# Patient Record
Sex: Male | Born: 1985 | Hispanic: Yes | Marital: Married | Smoking: Never smoker
Health system: Southern US, Community
[De-identification: ages and names within clinical notes are randomized; demographics above are authoritative.]

## PROBLEM LIST (undated history)

## (undated) DIAGNOSIS — F1290 Cannabis use, unspecified, uncomplicated: Secondary | ICD-10-CM

## (undated) DIAGNOSIS — W3400XA Accidental discharge from unspecified firearms or gun, initial encounter: Secondary | ICD-10-CM

## (undated) DIAGNOSIS — H9192 Unspecified hearing loss, left ear: Secondary | ICD-10-CM

## (undated) HISTORY — DX: Unspecified hearing loss, left ear: H91.92

## (undated) HISTORY — DX: Accidental discharge from unspecified firearms or gun, initial encounter: W34.00XA

---
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
## 2008-12-04 ENCOUNTER — Emergency Department (HOSPITAL_COMMUNITY): Admission: EM | Admit: 2008-12-04 | Discharge: 2008-12-04 | Payer: Self-pay | Admitting: Emergency Medicine

## 2013-11-08 ENCOUNTER — Emergency Department (HOSPITAL_COMMUNITY): Payer: Self-pay

## 2013-11-08 ENCOUNTER — Emergency Department (HOSPITAL_COMMUNITY)
Admission: EM | Admit: 2013-11-08 | Discharge: 2013-11-08 | Disposition: A | Discharge status: Home / Self Care | Payer: No Typology Code available for payment source | Attending: Emergency Medicine | Admitting: Emergency Medicine

## 2013-11-08 ENCOUNTER — Emergency Department (HOSPITAL_COMMUNITY): Payer: No Typology Code available for payment source

## 2013-11-08 ENCOUNTER — Encounter (HOSPITAL_COMMUNITY): Payer: Self-pay | Admitting: Emergency Medicine

## 2013-11-08 DIAGNOSIS — Y92410 Unspecified street and highway as the place of occurrence of the external cause: Secondary | ICD-10-CM | POA: Insufficient documentation

## 2013-11-08 DIAGNOSIS — F10920 Alcohol use, unspecified with intoxication, uncomplicated: Secondary | ICD-10-CM

## 2013-11-08 DIAGNOSIS — S2239XA Fracture of one rib, unspecified side, initial encounter for closed fracture: Secondary | ICD-10-CM | POA: Insufficient documentation

## 2013-11-08 DIAGNOSIS — R4182 Altered mental status, unspecified: Secondary | ICD-10-CM | POA: Insufficient documentation

## 2013-11-08 DIAGNOSIS — IMO0002 Reserved for concepts with insufficient information to code with codable children: Secondary | ICD-10-CM | POA: Insufficient documentation

## 2013-11-08 DIAGNOSIS — S2231XA Fracture of one rib, right side, initial encounter for closed fracture: Secondary | ICD-10-CM

## 2013-11-08 DIAGNOSIS — Y9389 Activity, other specified: Secondary | ICD-10-CM | POA: Insufficient documentation

## 2013-11-08 DIAGNOSIS — F1010 Alcohol abuse, uncomplicated: Secondary | ICD-10-CM | POA: Insufficient documentation

## 2013-11-08 LAB — BASIC METABOLIC PANEL
ANION GAP: 14 (ref 5–15)
BUN: 12 mg/dL (ref 6–23)
CALCIUM: 8.8 mg/dL (ref 8.4–10.5)
CHLORIDE: 101 meq/L (ref 96–112)
CO2: 24 meq/L (ref 19–32)
Creatinine, Ser: 0.96 mg/dL (ref 0.50–1.35)
GFR calc Af Amer: >90 mL/min (ref >90)
GFR calc non Af Amer: >90 mL/min (ref >90)
GLUCOSE: 100 mg/dL — AB (ref 70–99)
POTASSIUM: 3.5 meq/L — AB (ref 3.7–5.3)
SODIUM: 139 meq/L (ref 137–147)

## 2013-11-08 LAB — CBC
HCT: 40.4 % (ref 39.0–52.0)
HEMOGLOBIN: 14 g/dL (ref 13.0–17.0)
MCH: 31 pg (ref 26.0–34.0)
MCHC: 34.7 g/dL (ref 30.0–36.0)
MCV: 89.4 fL (ref 78.0–100.0)
PLATELETS: 185 10*3/uL (ref 150–400)
RBC: 4.52 MIL/uL (ref 4.22–5.81)
RDW: 12.9 % (ref 11.5–15.5)
WBC: 13.2 10*3/uL — AB (ref 4.0–10.5)

## 2013-11-08 LAB — ETHANOL: Alcohol, Ethyl (B): 259 mg/dL — ABNORMAL HIGH (ref 0–11)

## 2013-11-08 MED ORDER — HYDROCODONE-ACETAMINOPHEN 5-325 MG PO TABS: 1.0000 | ORAL_TABLET | ORAL | Status: DC | PRN

## 2013-11-08 NOTE — ED Notes (Signed)
Pt given phone to call friend to come to pick him up.

## 2013-11-08 NOTE — ED Notes (Signed)
Per EMS and [employer]-no LOC

## 2013-11-08 NOTE — ED Notes (Signed)
Bed: WA15 Expected date:  Expected time:  Means of arrival:  Comments: Ems 

## 2013-11-08 NOTE — ED Notes (Signed)
Pt ambulated to bathroom without assistance 
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
## 2013-11-08 NOTE — Discharge Instructions (Signed)
Fracturas de costilla  (Rib Fracture)  Una fractura de costilla es la ruptura de uno de los huesos que la forman. Las costillas son un grupo de huesos largos y curvos que rodean el pecho y estn adheridos a la columna vertebral. Protegen a los pulmones y a otros rganos que se encuentran en la cavidad torcica. La fractura o fisura de una costilla puede ser dolorosa pero no causa otros problemas. La mayora de las fracturas de costillas se curan por s mismas luego de un tiempo. Sin embargo, puede llegar a ser ms grave si se rompen varias costillas o si se desplazan y empujan otras estructuras. CAUSAS   Un golpe directo en el pecho. Por ejemplo, esto puede ocurrir durante la prctica de deportes de contacto, un accidente automovilstico o una cada sobre un objeto duro.  Los movimientos repetitivos con una gran fuerza, como al lanzar una pelota en el bisbol, o tener episodios de tos intensos. SNTOMAS   Dolor al respirar o al toser.  Dolor cuando alguien presiona la zona lesionada. DIAGNSTICO  El mdico le har un examen fsico. Le indicar diferentes estudios por imgenes para confirmar el diagnstico y buscar lesiones relacionadas. Estos estudios incluyen radiografas de trax, tomografa computada (TC), resonancia magntica (MRI) o gammagrafa sea.  TRATAMIENTO  La mayora de las fracturas de costillas se curan por s mismas en 1 a 3 meses. Los perodos de curacin ms prolongados generalmente se asocian a tos continua o a otras actividades que agravan el problema. Durante el perodo de curacin es muy importante el control del dolor. Generalmente se recetan medicamentos para controlar el dolor. Ser necesaria la hospitalizacin o una ciruga si hay lesiones ms graves, como las fracturas de mltiples costillas o aquellas en las que las costillas se desplazan.  INSTRUCCIONES PARA EL CUIDADO EN EL HOGAR   Evite las actividades extenuantes y toda actividad o movimiento que le cause dolor.  Realice las actividades con cuidado y evite golpearse las costillas lesionadas.  Reanude la actividad sexual cuando le indique su mdico.  Tome slo medicamentos de venta libre o recetados, segn las indicaciones del mdico. No tome otros medicamentos sin consultar a su mdico.  Aplique hielo en la zona lesionada durante los primeros 1 a 2 das despus de haber recibido tratamiento o segn las indicaciones de su mdico. La aplicacin del hielo ayuda a reducir la inflamacin y el dolor.  Ponga el hielo en una bolsa plstica.  Colquese una toalla entre la piel y la bolsa de hielo.   Deje el hielo durante 15 a 20 minutos, cada 2 horas mientras se encuentre despierto.  Haga ejercicios de respiracin como le indique su mdico. Esto ayudar a evitar la neumona, que es una complicacin frecuente en las fracturas de costilla. El mdico le indicar que:  Haga respiraciones profundas varias veces al da.  Trate de toser varias veces al da, sosteniendo el rea lesionada con una almohada.  Use un dispositivo llamado espirmetro incentivo para realizar respiraciones profundas varias veces por da.  Beba suficiente lquido para mantener la orina clara o de color amarillo plido. Esto le ayudar a evitar el estreimiento.   No use cinturones ni sujetadores. Esta limitacin de la respiracin puede causar neumona.  SOLICITE ATENCIN MDICA DE INMEDIATO SI:   Tiene fiebre.  Tiene dificultad para respirar o le falta el aire.   Tiene tos continua o elimina moco espeso o sanguinolento.  Tiene malestar estomacal (nuseas), devuelve (vomita), o tiene dolor abdominal.   El dolor   aumenta y no se alivia con los medicamentos.  ASEGRESE DE QUE:   Comprende estas instrucciones.  Controlar su enfermedad.  Recibir ayuda de inmediato si no mejora o si empeora. Document Released: 01/15/2005 Document Revised: 12/08/2012 [employer] Patient Information 2015 [employer]. This information is not  intended to replace advice given to you by your health care provider. Make sure you discuss any questions you have with your health care provider.  

## 2013-11-08 NOTE — ED Notes (Signed)
Breakfast tray given. °
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
## 2015-04-22 DIAGNOSIS — W3400XA Accidental discharge from unspecified firearms or gun, initial encounter: Secondary | ICD-10-CM

## 2015-04-22 DIAGNOSIS — H9192 Unspecified hearing loss, left ear: Secondary | ICD-10-CM

## 2015-04-22 HISTORY — DX: Accidental discharge from unspecified firearms or gun, initial encounter: W34.00XA

## 2015-04-22 HISTORY — DX: Unspecified hearing loss, left ear: H91.92

## 2015-07-24 ENCOUNTER — Emergency Department (HOSPITAL_COMMUNITY): Payer: Self-pay

## 2015-07-24 ENCOUNTER — Emergency Department (HOSPITAL_BASED_OUTPATIENT_CLINIC_OR_DEPARTMENT_OTHER): Payer: Self-pay

## 2015-07-24 ENCOUNTER — Inpatient Hospital Stay (HOSPITAL_COMMUNITY): Payer: Self-pay

## 2015-07-24 ENCOUNTER — Inpatient Hospital Stay (HOSPITAL_BASED_OUTPATIENT_CLINIC_OR_DEPARTMENT_OTHER)
Admission: EM | Admit: 2015-07-24 | Discharge: 2015-08-06 | DRG: 003 | Disposition: A | Discharge status: Home / Self Care | Payer: Self-pay | Attending: General Surgery | Admitting: General Surgery

## 2015-07-24 ENCOUNTER — Inpatient Hospital Stay (HOSPITAL_COMMUNITY): Payer: Self-pay | Admitting: Certified Registered Nurse Anesthetist

## 2015-07-24 ENCOUNTER — Encounter (HOSPITAL_COMMUNITY): Admission: EM | Disposition: A | Discharge status: Home / Self Care | Payer: Self-pay | Source: Home / Self Care

## 2015-07-24 ENCOUNTER — Other Ambulatory Visit (HOSPITAL_COMMUNITY): Payer: Self-pay

## 2015-07-24 ENCOUNTER — Encounter (HOSPITAL_COMMUNITY): Payer: Self-pay | Admitting: Radiology

## 2015-07-24 ENCOUNTER — Emergency Department (HOSPITAL_COMMUNITY): Payer: Self-pay | Admitting: Certified Registered"

## 2015-07-24 DIAGNOSIS — Z930 Tracheostomy status: Secondary | ICD-10-CM

## 2015-07-24 DIAGNOSIS — R402322 Coma scale, best motor response, extension, at arrival to emergency department: Secondary | ICD-10-CM | POA: Diagnosis present

## 2015-07-24 DIAGNOSIS — R040 Epistaxis: Secondary | ICD-10-CM | POA: Diagnosis present

## 2015-07-24 DIAGNOSIS — Z23 Encounter for immunization: Secondary | ICD-10-CM

## 2015-07-24 DIAGNOSIS — S0269XB Fracture of mandible of other specified site, initial encounter for open fracture: Principal | ICD-10-CM | POA: Diagnosis present

## 2015-07-24 DIAGNOSIS — S0240DB Maxillary fracture, left side, initial encounter for open fracture: Secondary | ICD-10-CM | POA: Diagnosis present

## 2015-07-24 DIAGNOSIS — Z4659 Encounter for fitting and adjustment of other gastrointestinal appliance and device: Secondary | ICD-10-CM

## 2015-07-24 DIAGNOSIS — R000 Tachycardia, unspecified: Secondary | ICD-10-CM | POA: Diagnosis present

## 2015-07-24 DIAGNOSIS — J690 Pneumonitis due to inhalation of food and vomit: Secondary | ICD-10-CM | POA: Diagnosis not present

## 2015-07-24 DIAGNOSIS — T1490XA Injury, unspecified, initial encounter: Secondary | ICD-10-CM

## 2015-07-24 DIAGNOSIS — S12390B Other displaced fracture of fourth cervical vertebra, initial encounter for open fracture: Secondary | ICD-10-CM | POA: Diagnosis present

## 2015-07-24 DIAGNOSIS — J9690 Respiratory failure, unspecified, unspecified whether with hypoxia or hypercapnia: Secondary | ICD-10-CM

## 2015-07-24 DIAGNOSIS — E876 Hypokalemia: Secondary | ICD-10-CM | POA: Diagnosis present

## 2015-07-24 DIAGNOSIS — S299XXA Unspecified injury of thorax, initial encounter: Secondary | ICD-10-CM

## 2015-07-24 DIAGNOSIS — D62 Acute posthemorrhagic anemia: Secondary | ICD-10-CM | POA: Diagnosis not present

## 2015-07-24 DIAGNOSIS — D72829 Elevated white blood cell count, unspecified: Secondary | ICD-10-CM

## 2015-07-24 DIAGNOSIS — R402112 Coma scale, eyes open, never, at arrival to emergency department: Secondary | ICD-10-CM | POA: Diagnosis present

## 2015-07-24 DIAGNOSIS — S1985XA Other specified injuries of pharynx and cervical esophagus, initial encounter: Secondary | ICD-10-CM | POA: Diagnosis present

## 2015-07-24 DIAGNOSIS — J9600 Acute respiratory failure, unspecified whether with hypoxia or hypercapnia: Secondary | ICD-10-CM | POA: Diagnosis present

## 2015-07-24 DIAGNOSIS — M542 Cervicalgia: Secondary | ICD-10-CM

## 2015-07-24 DIAGNOSIS — R339 Retention of urine, unspecified: Secondary | ICD-10-CM | POA: Diagnosis present

## 2015-07-24 DIAGNOSIS — T17908A Unspecified foreign body in respiratory tract, part unspecified causing other injury, initial encounter: Secondary | ICD-10-CM

## 2015-07-24 DIAGNOSIS — N170 Acute kidney failure with tubular necrosis: Secondary | ICD-10-CM | POA: Diagnosis not present

## 2015-07-24 DIAGNOSIS — Z9911 Dependence on respirator [ventilator] status: Secondary | ICD-10-CM

## 2015-07-24 DIAGNOSIS — D696 Thrombocytopenia, unspecified: Secondary | ICD-10-CM | POA: Diagnosis present

## 2015-07-24 DIAGNOSIS — S15009A Unspecified injury of unspecified carotid artery, initial encounter: Secondary | ICD-10-CM | POA: Diagnosis present

## 2015-07-24 DIAGNOSIS — R402222 Coma scale, best verbal response, incomprehensible words, at arrival to emergency department: Secondary | ICD-10-CM | POA: Diagnosis present

## 2015-07-24 DIAGNOSIS — S129XXA Fracture of neck, unspecified, initial encounter: Secondary | ICD-10-CM | POA: Diagnosis present

## 2015-07-24 DIAGNOSIS — J189 Pneumonia, unspecified organism: Secondary | ICD-10-CM | POA: Diagnosis not present

## 2015-07-24 DIAGNOSIS — W3400XA Accidental discharge from unspecified firearms or gun, initial encounter: Secondary | ICD-10-CM

## 2015-07-24 DIAGNOSIS — S270XXA Traumatic pneumothorax, initial encounter: Secondary | ICD-10-CM | POA: Diagnosis present

## 2015-07-24 DIAGNOSIS — I720 Aneurysm of carotid artery: Secondary | ICD-10-CM | POA: Diagnosis present

## 2015-07-24 HISTORY — DX: Cannabis use, unspecified, uncomplicated: F12.90

## 2015-07-24 HISTORY — PX: TRACHEOSTOMY TUBE PLACEMENT: SHX814

## 2015-07-24 HISTORY — PX: FACIAL LACERATION REPAIR: SHX6589

## 2015-07-24 HISTORY — PX: RADIOLOGY WITH ANESTHESIA: SHX6223

## 2015-07-24 LAB — CBC WITH DIFFERENTIAL/PLATELET
BASOS ABS: 0 10*3/uL (ref 0.0–0.1)
BASOS PCT: 0 %
EOS ABS: 0.1 10*3/uL (ref 0.0–0.7)
Eosinophils Relative: 1 %
HEMATOCRIT: 31 % — AB (ref 39.0–52.0)
HEMOGLOBIN: 10.4 g/dL — AB (ref 13.0–17.0)
LYMPHS PCT: 20 %
Lymphs Abs: 1.8 10*3/uL (ref 0.7–4.0)
MCH: 30.9 pg (ref 26.0–34.0)
MCHC: 33.5 g/dL (ref 30.0–36.0)
MCV: 92 fL (ref 78.0–100.0)
MONO ABS: 0.3 10*3/uL (ref 0.1–1.0)
Monocytes Relative: 3 %
NEUTROS PCT: 76 %
Neutro Abs: 6.7 10*3/uL (ref 1.7–7.7)
Platelets: 34 10*3/uL — ABNORMAL LOW (ref 150–400)
RBC: 3.37 MIL/uL — ABNORMAL LOW (ref 4.22–5.81)
RDW: 14.1 % (ref 11.5–15.5)
WBC: 8.9 10*3/uL (ref 4.0–10.5)

## 2015-07-24 LAB — POCT I-STAT 7, (LYTES, BLD GAS, ICA,H+H)
Acid-base deficit: 2 mmol/L (ref 0.0–2.0)
Acid-base deficit: 1 mmol/L (ref 0.0–2.0)
Bicarbonate: 25.2 meq/L — ABNORMAL HIGH (ref 20.0–24.0)
Bicarbonate: 26.1 meq/L — ABNORMAL HIGH (ref 20.0–24.0)
CALCIUM ION: 1.27 mmol/L — AB (ref 1.12–1.23)
Calcium, Ion: 0.92 mmol/L — ABNORMAL LOW (ref 1.12–1.23)
HCT: 18 % — ABNORMAL LOW (ref 39.0–52.0)
HEMATOCRIT: 26 % — AB (ref 39.0–52.0)
HEMOGLOBIN: 6.1 g/dL — AB (ref 13.0–17.0)
HEMOGLOBIN: 8.8 g/dL — AB (ref 13.0–17.0)
O2 SAT: 100 %
O2 SAT: 98 %
PCO2 ART: 51.2 mmHg — AB (ref 35.0–45.0)
PH ART: 7.29 — AB (ref 7.350–7.450)
PO2 ART: 380 mmHg — AB (ref 80.0–100.0)
POTASSIUM: 4.3 mmol/L (ref 3.5–5.1)
POTASSIUM: 4.1 mmol/L (ref 3.5–5.1)
Patient temperature: 35
Sodium: 148 mmol/L — ABNORMAL HIGH (ref 135–145)
Sodium: 148 mmol/L — ABNORMAL HIGH (ref 135–145)
TCO2: 27 mmol/L (ref 0–100)
TCO2: 28 mmol/L (ref 0–100)
pCO2 arterial: 54.5 mmHg — ABNORMAL HIGH (ref 35.0–45.0)
pH, Arterial: 7.288 — ABNORMAL LOW (ref 7.350–7.450)
pO2, Arterial: 113 mmHg — ABNORMAL HIGH (ref 80.0–100.0)

## 2015-07-24 LAB — MASSIVE TRANSFUSION PROTOCOL ORDER (BLOOD BANK NOTIFICATION)

## 2015-07-24 LAB — CBC
HCT: 28.5 % — ABNORMAL LOW (ref 39.0–52.0)
HCT: 31.5 % — ABNORMAL LOW (ref 39.0–52.0)
HCT: 31.4 % — ABNORMAL LOW (ref 39.0–52.0)
HEMOGLOBIN: 9.7 g/dL — AB (ref 13.0–17.0)
Hemoglobin: 11 g/dL — ABNORMAL LOW (ref 13.0–17.0)
Hemoglobin: 10.8 g/dL — ABNORMAL LOW (ref 13.0–17.0)
MCH: 31.3 pg (ref 26.0–34.0)
MCH: 30.7 pg (ref 26.0–34.0)
MCH: 29.1 pg (ref 26.0–34.0)
MCHC: 34 g/dL (ref 30.0–36.0)
MCHC: 34.9 g/dL (ref 30.0–36.0)
MCHC: 34.4 g/dL (ref 30.0–36.0)
MCV: 91.9 fL (ref 78.0–100.0)
MCV: 88 fL (ref 78.0–100.0)
MCV: 84.6 fL (ref 78.0–100.0)
PLATELETS: 86 10*3/uL — AB (ref 150–400)
Platelets: 138 10*3/uL — ABNORMAL LOW (ref 150–400)
Platelets: 92 K/uL — ABNORMAL LOW (ref 150–400)
RBC: 3.1 MIL/uL — AB (ref 4.22–5.81)
RBC: 3.58 MIL/uL — ABNORMAL LOW (ref 4.22–5.81)
RBC: 3.71 MIL/uL — ABNORMAL LOW (ref 4.22–5.81)
RDW: 13.5 % (ref 11.5–15.5)
RDW: 13.4 % (ref 11.5–15.5)
RDW: 13.7 % (ref 11.5–15.5)
WBC: 15.8 10*3/uL — AB (ref 4.0–10.5)
WBC: 5.5 K/uL (ref 4.0–10.5)
WBC: 5.1 10*3/uL (ref 4.0–10.5)

## 2015-07-24 LAB — COMPREHENSIVE METABOLIC PANEL
ALK PHOS: 40 U/L (ref 38–126)
ALT: 13 U/L — ABNORMAL LOW (ref 17–63)
ANION GAP: 9 (ref 5–15)
AST: 20 U/L (ref 15–41)
Albumin: 1.8 g/dL — ABNORMAL LOW (ref 3.5–5.0)
BUN: 10 mg/dL (ref 6–20)
CALCIUM: 6.3 mg/dL — AB (ref 8.9–10.3)
CO2: 20 mmol/L — AB (ref 22–32)
Chloride: 111 mmol/L (ref 101–111)
Creatinine, Ser: 1.51 mg/dL — ABNORMAL HIGH (ref 0.61–1.24)
GFR calc non Af Amer: >60 mL/min (ref >60)
Glucose, Bld: 266 mg/dL — ABNORMAL HIGH (ref 65–99)
Potassium: 2.8 mmol/L — ABNORMAL LOW (ref 3.5–5.1)
SODIUM: 140 mmol/L (ref 135–145)
TOTAL PROTEIN: 3.5 g/dL — AB (ref 6.5–8.1)
Total Bilirubin: 0.3 mg/dL (ref 0.3–1.2)

## 2015-07-24 LAB — I-STAT CHEM 8, ED
BUN: 9 mg/dL (ref 6–20)
CHLORIDE: 107 mmol/L (ref 101–111)
Calcium, Ion: 0.91 mmol/L — ABNORMAL LOW (ref 1.12–1.23)
Creatinine, Ser: 1.5 mg/dL — ABNORMAL HIGH (ref 0.61–1.24)
Glucose, Bld: 250 mg/dL — ABNORMAL HIGH (ref 65–99)
HEMATOCRIT: 27 % — AB (ref 39.0–52.0)
Hemoglobin: 9.2 g/dL — ABNORMAL LOW (ref 13.0–17.0)
POTASSIUM: 3 mmol/L — AB (ref 3.5–5.1)
Sodium: 145 mmol/L (ref 135–145)
TCO2: 20 mmol/L (ref 0–100)

## 2015-07-24 LAB — I-STAT ARTERIAL BLOOD GAS, ED
ACID-BASE DEFICIT: 11 mmol/L — AB (ref 0.0–2.0)
BICARBONATE: 19 meq/L — AB (ref 20.0–24.0)
O2 Saturation: 100 %
TCO2: 21 mmol/L (ref 0–100)
pCO2 arterial: 64.6 mmHg (ref 35.0–45.0)
pH, Arterial: 7.076 — CL (ref 7.350–7.450)
pO2, Arterial: 266 mmHg — ABNORMAL HIGH (ref 80.0–100.0)

## 2015-07-24 LAB — BASIC METABOLIC PANEL
Anion gap: 7 (ref 5–15)
BUN: 11 mg/dL (ref 6–20)
CHLORIDE: 115 mmol/L — AB (ref 101–111)
CO2: 27 mmol/L (ref 22–32)
Calcium: 8.5 mg/dL — ABNORMAL LOW (ref 8.9–10.3)
Creatinine, Ser: 0.92 mg/dL (ref 0.61–1.24)
GFR calc non Af Amer: >60 mL/min (ref >60)
GLUCOSE: 74 mg/dL (ref 65–99)
Potassium: 4.3 mmol/L (ref 3.5–5.1)
SODIUM: 149 mmol/L — AB (ref 135–145)

## 2015-07-24 LAB — PROTIME-INR
INR: 2.22 — ABNORMAL HIGH (ref 0.00–1.49)
INR: 1.22 (ref 0.00–1.49)
PROTHROMBIN TIME: 24.4 s — AB (ref 11.6–15.2)
Prothrombin Time: 15.5 s — ABNORMAL HIGH (ref 11.6–15.2)

## 2015-07-24 LAB — POCT I-STAT 3, ART BLOOD GAS (G3+)
Acid-Base Excess: 1 mmol/L (ref 0.0–2.0)
Bicarbonate: 27.7 meq/L — ABNORMAL HIGH (ref 20.0–24.0)
O2 Saturation: 98 %
Patient temperature: 36
TCO2: 29 mmol/L (ref 0–100)
pCO2 arterial: 54.3 mmHg — ABNORMAL HIGH (ref 35.0–45.0)
pH, Arterial: 7.311 — ABNORMAL LOW (ref 7.350–7.450)
pO2, Arterial: 121 mmHg — ABNORMAL HIGH (ref 80.0–100.0)

## 2015-07-24 LAB — DIC (DISSEMINATED INTRAVASCULAR COAGULATION)PANEL
Fibrinogen: 147 mg/dL — ABNORMAL LOW (ref 204–475)
Smear Review: NONE SEEN
aPTT: 30 seconds (ref 24–37)

## 2015-07-24 LAB — ABO/RH: ABO/RH(D): A POS

## 2015-07-24 LAB — DIC (DISSEMINATED INTRAVASCULAR COAGULATION) PANEL
D DIMER QUANT: 2.09 ug{FEU}/mL — AB (ref 0.00–0.50)
INR: 1.46 (ref 0.00–1.49)
PLATELETS: 134 10*3/uL — AB (ref 150–400)
PROTHROMBIN TIME: 17.9 s — AB (ref 11.6–15.2)

## 2015-07-24 LAB — LACTIC ACID, PLASMA
LACTIC ACID, VENOUS: 1.1 mmol/L (ref 0.5–2.0)
Lactic Acid, Venous: 2.7 mmol/L (ref 0.5–2.0)

## 2015-07-24 LAB — FIBRINOGEN
FIBRINOGEN: 264 mg/dL (ref 204–475)
Fibrinogen: 138 mg/dL — ABNORMAL LOW (ref 204–475)

## 2015-07-24 LAB — MRSA PCR SCREENING: MRSA BY PCR: NEGATIVE

## 2015-07-24 LAB — ETHANOL: ALCOHOL ETHYL (B): 123 mg/dL — AB (ref <5)

## 2015-07-24 SURGERY — RADIOLOGY WITH ANESTHESIA
Anesthesia: General

## 2015-07-24 SURGERY — CREATION, TRACHEOSTOMY
Anesthesia: General | Site: Neck

## 2015-07-24 MED ORDER — ROCURONIUM BROMIDE 50 MG/5ML IV SOLN
1.0000 mg/kg | Freq: Once | INTRAVENOUS | Status: AC
  Administered 2015-07-24: 03:00:00 via INTRAVENOUS

## 2015-07-24 MED ORDER — IOHEXOL 300 MG/ML  SOLN
150.0000 mL | Freq: Once | INTRAMUSCULAR | Status: AC | PRN
  Administered 2015-07-24: 150 mL via INTRAVENOUS

## 2015-07-24 MED ORDER — SODIUM CHLORIDE 0.9 % IV SOLN
1.0000 mg/h | INTRAVENOUS | Status: DC
  Administered 2015-07-24: 1 mg/h via INTRAVENOUS
  Filled 2015-07-24: qty 10

## 2015-07-24 MED ORDER — VECURONIUM BROMIDE 10 MG IV SOLR
INTRAVENOUS | Status: AC | PRN
  Administered 2015-07-24: 10 mg via INTRAVENOUS

## 2015-07-24 MED ORDER — SODIUM CHLORIDE 0.9 % IV SOLN
10.0000 ug/h | INTRAVENOUS | Status: DC
  Administered 2015-07-24: 25 ug/h via INTRAVENOUS
  Filled 2015-07-24: qty 50

## 2015-07-24 MED ORDER — PROPOFOL 500 MG/50ML IV EMUL
INTRAVENOUS | Status: DC | PRN
  Administered 2015-07-24: 75 ug/kg/min via INTRAVENOUS

## 2015-07-24 MED ORDER — ONDANSETRON HCL 4 MG PO TABS: 4.0000 mg | ORAL_TABLET | Freq: Four times a day (QID) | ORAL | Status: DC | PRN

## 2015-07-24 MED ORDER — PROPOFOL 1000 MG/100ML IV EMUL
INTRAVENOUS | Status: AC
  Filled 2015-07-24: qty 100

## 2015-07-24 MED ORDER — SODIUM BICARBONATE 8.4 % IV SOLN
INTRAVENOUS | Status: DC | PRN
  Administered 2015-07-24: 50 meq via INTRAVENOUS

## 2015-07-24 MED ORDER — FENTANYL CITRATE (PF) 100 MCG/2ML IJ SOLN
INTRAMUSCULAR | Status: DC | PRN
  Administered 2015-07-24 (×2): 50 ug via INTRAVENOUS

## 2015-07-24 MED ORDER — 0.9 % SODIUM CHLORIDE (POUR BTL) OPTIME
TOPICAL | Status: DC | PRN
  Administered 2015-07-24: 1000 mL

## 2015-07-24 MED ORDER — ALBUMIN HUMAN 5 % IV SOLN
INTRAVENOUS | Status: DC | PRN
  Administered 2015-07-24: 10:00:00 via INTRAVENOUS

## 2015-07-24 MED ORDER — ROCURONIUM BROMIDE 100 MG/10ML IV SOLN
INTRAVENOUS | Status: DC | PRN
  Administered 2015-07-24 (×4): 50 mg via INTRAVENOUS

## 2015-07-24 MED ORDER — LIDOCAINE-EPINEPHRINE 1 %-1:100000 IJ SOLN
INTRAMUSCULAR | Status: DC | PRN
  Administered 2015-07-24: 3 mL

## 2015-07-24 MED ORDER — ONDANSETRON HCL 4 MG/2ML IJ SOLN
4.0000 mg | Freq: Four times a day (QID) | INTRAMUSCULAR | Status: DC | PRN
  Administered 2015-07-31 – 2015-08-02 (×4): 4 mg via INTRAVENOUS
  Filled 2015-07-24 (×5): qty 2

## 2015-07-24 MED ORDER — LACTATED RINGERS IV SOLN
INTRAVENOUS | Status: DC | PRN
  Administered 2015-07-24 (×2): via INTRAVENOUS

## 2015-07-24 MED ORDER — CHLORHEXIDINE GLUCONATE 0.12% ORAL RINSE (MEDLINE KIT)
15.0000 mL | Freq: Two times a day (BID) | OROMUCOSAL | Status: DC
  Administered 2015-07-24 – 2015-08-01 (×17): 15 mL via OROMUCOSAL

## 2015-07-24 MED ORDER — MIDAZOLAM HCL 5 MG/5ML IJ SOLN
INTRAMUSCULAR | Status: DC | PRN
  Administered 2015-07-24: 2 mg via INTRAVENOUS

## 2015-07-24 MED ORDER — CALCIUM CHLORIDE 10 % IV SOLN
INTRAVENOUS | Status: DC | PRN
  Administered 2015-07-24 (×2): .2 g via INTRAVENOUS
  Administered 2015-07-24: 1 g via INTRAVENOUS
  Administered 2015-07-24: .4 g via INTRAVENOUS
  Administered 2015-07-24: 1 g via INTRAVENOUS
  Administered 2015-07-24 (×3): .2 g via INTRAVENOUS

## 2015-07-24 MED ORDER — TRANEXAMIC ACID 1000 MG/10ML IV SOLN
1000.0000 mg | Freq: Once | INTRAVENOUS | Status: AC
  Administered 2015-07-24: 1000 mg via INTRAVENOUS
  Filled 2015-07-24: qty 10

## 2015-07-24 MED ORDER — IOPAMIDOL (ISOVUE-300) INJECTION 61%
100.0000 mL | Freq: Once | INTRAVENOUS | Status: AC | PRN
  Administered 2015-07-24: 100 mL via INTRAVENOUS

## 2015-07-24 MED ORDER — FENTANYL CITRATE (PF) 100 MCG/2ML IJ SOLN
50.0000 ug | Freq: Once | INTRAMUSCULAR | Status: AC
  Administered 2015-07-24: 50 ug via INTRAVENOUS

## 2015-07-24 MED ORDER — CHLORHEXIDINE GLUCONATE 0.12% ORAL RINSE (MEDLINE KIT): 15.0000 mL | Freq: Two times a day (BID) | OROMUCOSAL | Status: DC

## 2015-07-24 MED ORDER — MIDAZOLAM HCL 5 MG/5ML IJ SOLN
INTRAMUSCULAR | Status: DC | PRN
  Administered 2015-07-24 (×3): 2 mg via INTRAVENOUS

## 2015-07-24 MED ORDER — NOREPINEPHRINE BITARTRATE 1 MG/ML IV SOLN
0.0000 ug/min | INTRAVENOUS | Status: DC
  Filled 2015-07-24: qty 16

## 2015-07-24 MED ORDER — PANTOPRAZOLE SODIUM 40 MG IV SOLR
40.0000 mg | Freq: Every day | INTRAVENOUS | Status: DC
  Administered 2015-07-24 – 2015-07-31 (×8): 40 mg via INTRAVENOUS
  Filled 2015-07-24 (×8): qty 40

## 2015-07-24 MED ORDER — HEMOSTATIC AGENTS (NO CHARGE) OPTIME
TOPICAL | Status: DC | PRN
  Administered 2015-07-24: 1

## 2015-07-24 MED ORDER — ONDANSETRON HCL 4 MG/2ML IJ SOLN
4.0000 mg | Freq: Four times a day (QID) | INTRAMUSCULAR | Status: DC | PRN
Start: 2015-07-24 — End: 2015-07-24

## 2015-07-24 MED ORDER — SODIUM CHLORIDE 0.9 % IV SOLN
INTRAVENOUS | Status: AC | PRN
  Administered 2015-07-24 (×2): 1000 mL via INTRAVENOUS

## 2015-07-24 MED ORDER — PROPOFOL 1000 MG/100ML IV EMUL
0.0000 ug/kg/min | INTRAVENOUS | Status: DC
  Administered 2015-07-24 (×3): 50 ug/kg/min via INTRAVENOUS
  Administered 2015-07-25 (×2): 30 ug/kg/min via INTRAVENOUS
  Administered 2015-07-25: 40 ug/kg/min via INTRAVENOUS
  Administered 2015-07-25: 35 ug/kg/min via INTRAVENOUS
  Administered 2015-07-26: 15 ug/kg/min via INTRAVENOUS
  Administered 2015-07-26: 25 ug/kg/min via INTRAVENOUS
  Administered 2015-07-26: 30 ug/kg/min via INTRAVENOUS
  Administered 2015-07-27: 20 ug/kg/min via INTRAVENOUS
  Administered 2015-07-27: 15 ug/kg/min via INTRAVENOUS
  Administered 2015-07-28: 20 ug/kg/min via INTRAVENOUS
  Administered 2015-07-28: 25 ug/kg/min via INTRAVENOUS
  Administered 2015-07-28: 20 ug/kg/min via INTRAVENOUS
  Administered 2015-07-29: 30 ug/kg/min via INTRAVENOUS
  Administered 2015-07-29: 25 ug/kg/min via INTRAVENOUS
  Administered 2015-07-30: 40 ug/kg/min via INTRAVENOUS
  Administered 2015-07-30: 30 ug/kg/min via INTRAVENOUS
  Administered 2015-07-30 – 2015-07-31 (×2): 40 ug/kg/min via INTRAVENOUS
  Administered 2015-07-31: 30 ug/kg/min via INTRAVENOUS
  Administered 2015-07-31: 40 ug/kg/min via INTRAVENOUS
  Filled 2015-07-24 (×26): qty 100

## 2015-07-24 MED ORDER — FENTANYL BOLUS VIA INFUSION
50.0000 ug | INTRAVENOUS | Status: DC | PRN
  Administered 2015-07-27: 50 ug via INTRAVENOUS
  Filled 2015-07-24: qty 50

## 2015-07-24 MED ORDER — SODIUM CHLORIDE 0.9 % IV SOLN
2.0000 g | Freq: Once | INTRAVENOUS | Status: AC
  Administered 2015-07-24: 2 g via INTRAVENOUS
  Filled 2015-07-24: qty 20

## 2015-07-24 MED ORDER — NOREPINEPHRINE BITARTRATE 1 MG/ML IV SOLN
5.0000 ug/min | Freq: Once | INTRAVENOUS | Status: AC
  Administered 2015-07-24: 10 ug/min via INTRAVENOUS
  Filled 2015-07-24: qty 4

## 2015-07-24 MED ORDER — MIDAZOLAM HCL 2 MG/2ML IJ SOLN
INTRAMUSCULAR | Status: AC
  Filled 2015-07-24: qty 4

## 2015-07-24 MED ORDER — NICARDIPINE HCL IN NACL 20-0.86 MG/200ML-% IV SOLN: 5.0000 mg/h | INTRAVENOUS | Status: DC

## 2015-07-24 MED ORDER — ACETAMINOPHEN 500 MG PO TABS
1000.0000 mg | ORAL_TABLET | Freq: Four times a day (QID) | ORAL | Status: DC | PRN
  Administered 2015-07-24 – 2015-07-29 (×7): 1000 mg via ORAL
  Filled 2015-07-24 (×7): qty 2

## 2015-07-24 MED ORDER — VECURONIUM BROMIDE 10 MG IV SOLR
INTRAVENOUS | Status: AC
  Filled 2015-07-24: qty 10

## 2015-07-24 MED ORDER — ALBUMIN HUMAN 5 % IV SOLN
INTRAVENOUS | Status: DC | PRN
  Administered 2015-07-24: 07:00:00 via INTRAVENOUS

## 2015-07-24 MED ORDER — PANTOPRAZOLE SODIUM 40 MG PO TBEC
40.0000 mg | DELAYED_RELEASE_TABLET | Freq: Every day | ORAL | Status: DC
  Filled 2015-07-24 (×3): qty 1

## 2015-07-24 MED ORDER — ALBUMIN HUMAN 5 % IV SOLN
12.5000 g | Freq: Once | INTRAVENOUS | Status: AC
  Administered 2015-07-24: 12.5 g via INTRAVENOUS
  Filled 2015-07-24: qty 250

## 2015-07-24 MED ORDER — MIDAZOLAM HCL 5 MG/5ML IJ SOLN
INTRAMUSCULAR | Status: AC | PRN
  Administered 2015-07-24: 4 mg via INTRAVENOUS

## 2015-07-24 MED ORDER — ANTISEPTIC ORAL RINSE SOLUTION (CORINZ): 7.0000 mL | Freq: Four times a day (QID) | OROMUCOSAL | Status: DC

## 2015-07-24 MED ORDER — VECURONIUM BROMIDE 10 MG IV SOLR
INTRAVENOUS | Status: AC
  Administered 2015-07-24: 10 mg
  Filled 2015-07-24: qty 10

## 2015-07-24 MED ORDER — SODIUM CHLORIDE 0.9 % IV SOLN
25.0000 ug/h | INTRAVENOUS | Status: DC
  Administered 2015-07-24: 50 ug/h via INTRAVENOUS
  Administered 2015-07-25: 250 ug/h via INTRAVENOUS
  Administered 2015-07-25: 200 ug/h via INTRAVENOUS
  Administered 2015-07-26: 300 ug/h via INTRAVENOUS
  Administered 2015-07-26 – 2015-07-29 (×5): 200 ug/h via INTRAVENOUS
  Administered 2015-07-30: 300 ug/h via INTRAVENOUS
  Administered 2015-07-31: 25 ug/h via INTRAVENOUS
  Administered 2015-07-31: 300 ug/h via INTRAVENOUS
  Filled 2015-07-24 (×17): qty 50

## 2015-07-24 MED ORDER — FENTANYL CITRATE (PF) 100 MCG/2ML IJ SOLN
INTRAMUSCULAR | Status: DC | PRN
Start: 2015-07-24 — End: 2015-07-24
  Administered 2015-07-24 (×3): 50 ug via INTRAVENOUS

## 2015-07-24 MED ORDER — IPRATROPIUM-ALBUTEROL 0.5-2.5 (3) MG/3ML IN SOLN: 3.0000 mL | Freq: Four times a day (QID) | RESPIRATORY_TRACT | Status: DC | PRN

## 2015-07-24 MED ORDER — ONDANSETRON HCL 4 MG/2ML IJ SOLN: 4.0000 mg | Freq: Once | INTRAMUSCULAR | Status: DC | PRN

## 2015-07-24 MED ORDER — LACTATED RINGERS IV SOLN
INTRAVENOUS | Status: DC | PRN
  Administered 2015-07-24 (×2): via INTRAVENOUS

## 2015-07-24 MED ORDER — SODIUM CHLORIDE 0.9 % IV SOLN: INTRAVENOUS | Status: DC

## 2015-07-24 MED ORDER — ATROPINE SULFATE 1 MG/ML IJ SOLN
INTRAMUSCULAR | Status: AC | PRN
  Administered 2015-07-24: 1 mg via INTRAVENOUS

## 2015-07-24 MED ORDER — PROPOFOL 1000 MG/100ML IV EMUL
INTRAVENOUS | Status: AC | PRN
  Administered 2015-07-24: 10 ug/kg/min via INTRAVENOUS

## 2015-07-24 MED ORDER — CEFAZOLIN SODIUM-DEXTROSE 2-4 GM/100ML-% IV SOLN
2.0000 g | Freq: Once | INTRAVENOUS | Status: AC
  Administered 2015-07-24: 2 g via INTRAVENOUS

## 2015-07-24 MED ORDER — ANTISEPTIC ORAL RINSE SOLUTION (CORINZ)
7.0000 mL | Freq: Four times a day (QID) | OROMUCOSAL | Status: DC
  Administered 2015-07-24 – 2015-08-01 (×31): 7 mL via OROMUCOSAL

## 2015-07-24 MED ORDER — DEXTROSE 5 % IV SOLN
4000.0000 ug | INTRAVENOUS | Status: DC | PRN
  Administered 2015-07-24: 40 ug/min via INTRAVENOUS

## 2015-07-24 MED ORDER — HYDROMORPHONE HCL 1 MG/ML IJ SOLN
0.5000 mg | INTRAMUSCULAR | Status: DC | PRN
  Filled 2015-07-24: qty 1

## 2015-07-24 MED ORDER — ACETAMINOPHEN 650 MG RE SUPP: 650.0000 mg | Freq: Four times a day (QID) | RECTAL | Status: DC | PRN

## 2015-07-24 MED ORDER — KCL IN DEXTROSE-NACL 20-5-0.45 MEQ/L-%-% IV SOLN
INTRAVENOUS | Status: DC
  Administered 2015-07-24: 1000 mL via INTRAVENOUS
  Administered 2015-07-24: 23:00:00 via INTRAVENOUS
  Administered 2015-07-25: 1000 mL via INTRAVENOUS
  Administered 2015-07-25 – 2015-07-26 (×2): via INTRAVENOUS
  Administered 2015-07-26: 1000 mL via INTRAVENOUS
  Administered 2015-07-27 – 2015-07-28 (×3): via INTRAVENOUS
  Administered 2015-07-28: 100 mL/h via INTRAVENOUS
  Administered 2015-07-29: 18:00:00 via INTRAVENOUS
  Filled 2015-07-24 (×15): qty 1000

## 2015-07-24 MED ORDER — ETOMIDATE 2 MG/ML IV SOLN
20.0000 mg | Freq: Once | INTRAVENOUS | Status: AC
  Administered 2015-07-24: 20 mg via INTRAVENOUS

## 2015-07-24 MED FILL — Medication: Qty: 1 | Status: AC

## 2015-07-24 MED FILL — Rocuronium Bromide IV Soln 100 MG/10ML (10 MG/ML): INTRAVENOUS | Qty: 10 | Status: AC

## 2015-07-24 SURGICAL SUPPLY — 46 items
BLADE 10 SAFETY STRL DISP (BLADE) ×4 IMPLANT
BLADE 11 SAFETY STRL DISP (BLADE) IMPLANT
BLADE 15 SAFETY STRL DISP (BLADE) IMPLANT
BLADE SURG 15 STRL LF DISP TIS (BLADE) IMPLANT
BLADE SURG 15 STRL SS (BLADE)
BLADE SURG ROTATE 9660 (MISCELLANEOUS) IMPLANT
CANISTER SUCTION 2500CC (MISCELLANEOUS) ×4 IMPLANT
CLEANER TIP ELECTROSURG 2X2 (MISCELLANEOUS) ×4 IMPLANT
COVER SURGICAL LIGHT HANDLE (MISCELLANEOUS) ×4 IMPLANT
DECANTER SPIKE VIAL GLASS SM (MISCELLANEOUS) ×4 IMPLANT
DRAPE PROXIMA HALF (DRAPES) IMPLANT
ELECT COATED BLADE 2.86 ST (ELECTRODE) ×4 IMPLANT
ELECT REM PT RETURN 9FT ADLT (ELECTROSURGICAL) ×4
ELECTRODE REM PT RTRN 9FT ADLT (ELECTROSURGICAL) ×2 IMPLANT
GAUZE SPONGE 4X4 16PLY XRAY LF (GAUZE/BANDAGES/DRESSINGS) IMPLANT
GAUZE XEROFORM 5X9 LF (GAUZE/BANDAGES/DRESSINGS) IMPLANT
GLOVE ECLIPSE 7.5 STRL STRAW (GLOVE) ×4 IMPLANT
GOWN STRL REUS W/ TWL LRG LVL3 (GOWN DISPOSABLE) ×6 IMPLANT
GOWN STRL REUS W/TWL LRG LVL3 (GOWN DISPOSABLE) ×12
HOLDER TRACH TUBE VELCRO 19.5 (MISCELLANEOUS) IMPLANT
KIT BASIN OR (CUSTOM PROCEDURE TRAY) ×4 IMPLANT
KIT ROOM TURNOVER OR (KITS) ×4 IMPLANT
KIT SUCTION CATH 14FR (SUCTIONS) IMPLANT
NDL HYPO 25GX1X1/2 BEV (NEEDLE) ×2 IMPLANT
NEEDLE HYPO 25GX1X1/2 BEV (NEEDLE) ×4 IMPLANT
NS IRRIG 1000ML POUR BTL (IV SOLUTION) ×4 IMPLANT
PAD ARMBOARD 7.5X6 YLW CONV (MISCELLANEOUS) ×8 IMPLANT
PENCIL BUTTON HOLSTER BLD 10FT (ELECTRODE) IMPLANT
SPONGE INTESTINAL PEANUT (DISPOSABLE) ×4 IMPLANT
SPONGE LAP 18X18 X RAY DECT (DISPOSABLE) ×2 IMPLANT
SUT CHROMIC 3 0 SH 27 (SUTURE) IMPLANT
SUT PROLENE 2 0 SH DA (SUTURE) ×4 IMPLANT
SUT PROLENE 5 0 RB 2 (SUTURE) ×2 IMPLANT
SUT SILK 3 0 (SUTURE) ×4
SUT SILK 3-0 18XBRD TIE 12 (SUTURE) ×2 IMPLANT
SUT VIC AB 3-0 SH 27 (SUTURE) ×4
SUT VIC AB 3-0 SH 27X BRD (SUTURE) IMPLANT
SYR 20CC LL (SYRINGE) IMPLANT
SYR BULB 3OZ (MISCELLANEOUS) IMPLANT
SYR CONTROL 10ML LL (SYRINGE) IMPLANT
TOWEL OR 17X24 6PK STRL BLUE (TOWEL DISPOSABLE) ×4 IMPLANT
TRAY ENT MC OR (CUSTOM PROCEDURE TRAY) ×4 IMPLANT
TUBE CONNECTING 12'X1/4 (SUCTIONS) ×1
TUBE CONNECTING 12X1/4 (SUCTIONS) ×3 IMPLANT
TUBE TRACH SHILEY  6 DIST  CUF (TUBING) ×2 IMPLANT
WATER STERILE IRR 1000ML POUR (IV SOLUTION) ×4 IMPLANT

## 2015-07-24 NOTE — ED Notes (Signed)
FFP [id number] given

## 2015-07-24 NOTE — ED Notes (Signed)
PRBC [id number] given

## 2015-07-24 NOTE — Code Documentation (Signed)
5th unit PRBC/ 5th FFP infusing .

## 2015-07-24 NOTE — Progress Notes (Signed)
   07/24/15 1245  Clinical Encounter Type  Visited With Family;Health care provider  Visit Type Initial;Post-op;Critical Care;Trauma   Chaplain met with patient's family (mom, mom's fiance, sister, two brothers, and a future sister-in-law). Family was waiting for an update from the surgeon. Chaplain facilitated medical consultation, and then helped patient's family transition to [facility] ICU. Chaplain offered support, and our services are available as needed.   Jeri Lager, Chaplain 07/24/2015 12:47 PM
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
## 2015-07-24 NOTE — ED Notes (Signed)
PRBC [id number] given

## 2015-07-24 NOTE — Progress Notes (Signed)
CRITICAL VALUE ALERT  Critical value received:  Lactic acid 2.7  Date of notification:  07/24/15  Time of notification:  1250  Critical value read back:Yes.    Nurse who received alert:  Cala BradfordKimberly  MD notified (1st page): Dr. Janee Mornhompson  Time of first page:  1251  MD notified (2nd page):  Time of second page:  Responding MD:  Dr. Janee Mornhompson  Time MD responded:  [phone]
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
## 2015-07-24 NOTE — ED Notes (Signed)
PRBC [id number] given

## 2015-07-24 NOTE — ED Notes (Signed)
PRBC [id number] given

## 2015-07-24 NOTE — Code Documentation (Signed)
1st unit FFP infusing.

## 2015-07-24 NOTE — ED Notes (Signed)
PRBC [id number] given

## 2015-07-24 NOTE — ED Notes (Signed)
PRBC [id number] given

## 2015-07-24 NOTE — Code Documentation (Signed)
2nd unit FFP infusing.

## 2015-07-24 NOTE — ED Notes (Signed)
PRBC [id number] given

## 2015-07-24 NOTE — ED Notes (Addendum)
13th unit PRBC / 15th FFP infusing , bleeding at nares/oral cavity continues , OGT /Foley catheter intact , CVC at right groin and IV sites intact .

## 2015-07-24 NOTE — ED Notes (Signed)
6th unit PRBC infusing , 5th FFP completed .

## 2015-07-24 NOTE — Progress Notes (Signed)
Pt arrived to 3M07 at this time from OR.  Placed pt on current settings per CRNA, will follow up with ABG.  RT will monitor.

## 2015-07-24 NOTE — ED Notes (Addendum)
Pt to OR.

## 2015-07-24 NOTE — ED Notes (Signed)
Returned from CT scan , 10th unit PRBC / 9th unit FFP infusing , 6th NS iv bolus infusing .

## 2015-07-24 NOTE — ED Notes (Signed)
FFP [id number] given

## 2015-07-24 NOTE — Sedation Documentation (Signed)
7 french sheath placed right groin

## 2015-07-24 NOTE — ED Notes (Signed)
Patient transported to CT scan . 

## 2015-07-24 NOTE — Code Documentation (Signed)
1st unit PRBC infusing . 4th/5th NS IV  bolus infusing .

## 2015-07-24 NOTE — Code Documentation (Signed)
4th FFP and 4th PRBC completed.

## 2015-07-24 NOTE — ED Notes (Signed)
FFB - [id number] given

## 2015-07-24 NOTE — Anesthesia Postprocedure Evaluation (Signed)
Anesthesia Post Note  Patient: Frederick [name]  Procedure(s) Performed: Procedure(s) (LRB): TRACHEOSTOMY with neck exploration. (N/A) FACIAL LACERATION REPAIR (Left)  Patient location during evaluation: ICU Anesthesia Type: General Level of consciousness: patient remains intubated per anesthesia plan Vital Signs Assessment: post-procedure vital signs reviewed and stable Respiratory status: patient remains intubated per anesthesia plan Cardiovascular status: blood pressure returned to baseline and stable Postop Assessment: no signs of nausea or vomiting Anesthetic complications: no    Last Vitals:  Filed Vitals:   07/24/15 0947 07/24/15 1147  BP:    Pulse:  98  Resp: 24 24    Last Pain: There were no vitals filed for this visit.               Shelton SilvasKevin D Kamon Fahr

## 2015-07-24 NOTE — ED Notes (Signed)
Pt. received a total of  15 units PRBC  ; 19 units  FFP , 1 unit Cryo , 1 unit Platelet , 7 bags of NS bolus at ER . [facility] transfused 3 units of PRBC.

## 2015-07-24 NOTE — ED Notes (Signed)
13th unit FFP infusing . Rapid transfuser line replaced.

## 2015-07-24 NOTE — ED Notes (Signed)
PRBC [id number] given
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
## 2015-07-24 NOTE — Procedures (Signed)
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

## 2015-07-24 NOTE — ED Notes (Signed)
Pt transported to IR 

## 2015-07-24 NOTE — ED Notes (Signed)
Platelet infusing

## 2015-07-24 NOTE — ED Notes (Addendum)
19 th unit FFP infusing , EDP / Surgeon at bedside . 14th unit PRBC completed . Fentanyl drip /Versed drip infusing . Levophed at 35 mcg/min infusing .

## 2015-07-24 NOTE — Code Documentation (Signed)
Levophed drip infusing at 7.5 mcg/min.

## 2015-07-24 NOTE — ED Notes (Signed)
FFP [id number] 17 [id number] given

## 2015-07-24 NOTE — Progress Notes (Signed)
Patient ID: Frederick [name], male   DOB: 04/17/1986, 30 y.o.   MRN: [id number] Patient in OR with Dr. Suszanne Connerseoh. Much more stable since angioembolization. Abd is distended - CT chest showed large amount of likely blood in stomach that he swallowed. OGT will be replaced by Dr. Suszanne Connerseoh and will plan irrigation of stomach. Violeta GelinasBurke Lajune Perine, MD, MPH, FACS Trauma: [phone] General Surgery: [phone]

## 2015-07-24 NOTE — ED Notes (Signed)
FFP [id number] 17 [id number] given

## 2015-07-24 NOTE — ED Notes (Signed)
FFP [id number] 17 [id number] given

## 2015-07-24 NOTE — Code Documentation (Signed)
3rd unit PRBC completed , 4th unit PRBC infusing .

## 2015-07-24 NOTE — ED Notes (Signed)
11th unit PRBC and 10th unit FFP / 1st unit CRYO infusing , Levophed at 35 mcg/min , Fentanyl 25 mcg./min  and Versed 1 mg/min infusing , OGT intact .

## 2015-07-24 NOTE — ED Notes (Signed)
FFP [id number] 17 [id number] given
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
## 2015-07-24 NOTE — Code Documentation (Signed)
3rd unit FFP and 4th PRBC infusing .

## 2015-07-24 NOTE — ED Notes (Signed)
FFP [id number] 17 [id number] given

## 2015-07-24 NOTE — Code Documentation (Signed)
2nd unit PRBC infusing using rapid infuser .

## 2015-07-24 NOTE — ED Notes (Signed)
FFP [id number] 17 [id number] given

## 2015-07-24 NOTE — ED Notes (Signed)
PRBC [id number] given

## 2015-07-24 NOTE — ED Notes (Signed)
Patient is in Neuro IR. Told Neuro IR to take patient to OR 9 immediately, for Dr. Suszanne Connerseoh.

## 2015-07-24 NOTE — ED Notes (Signed)
7th unit PRBC / 7th FFP infusing .

## 2015-07-24 NOTE — ED Notes (Signed)
FFP [id number] 17 [id number] given

## 2015-07-24 NOTE — Progress Notes (Signed)
Patient ID: Frederick [name], male   DOB: 07/06/1985, 30 y.o.   MRN: [id number] I spoke with his mother and sister at the bedside and updated them. Base deficit corrected. Labs Raylene MiyamotoP. Terese Heier, MD, MPH, FACS Trauma: [phone] General Surgery: [phone]
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
## 2015-07-24 NOTE — ED Notes (Signed)
Cryo [id number] given

## 2015-07-24 NOTE — ED Notes (Signed)
FFP [id number] 17 [id number] given

## 2015-07-24 NOTE — ED Notes (Signed)
PRBC [id number] given

## 2015-07-24 NOTE — ED Notes (Signed)
FFP [id number] 17 [id number] given
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
## 2015-07-24 NOTE — ED Notes (Signed)
FFP [id number] 17 [id number] given
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
## 2015-07-24 NOTE — ED Notes (Signed)
FFP [id number] 17 [id number] given
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
## 2015-07-24 NOTE — ED Notes (Signed)
PRBC [id number] given

## 2015-07-24 NOTE — ED Notes (Signed)
FFP [id number] 17 [id number] given

## 2015-07-24 NOTE — Code Documentation (Signed)
6th FFP infusing , currently at CT scan .

## 2015-07-24 NOTE — ED Notes (Signed)
17 unit FFP / 14 unit PRBC infusing / 7th NS bolus completed , waiting call from IR to transport pt.

## 2015-07-24 NOTE — ED Notes (Signed)
FFP [id number] 17 [id number] given

## 2015-07-24 NOTE — ED Notes (Signed)
FFP [id number] 17 [id number] given

## 2015-07-24 NOTE — ED Notes (Signed)
11th FFP / 12th PRBC infusing , 6th NS bolus completed . OGT/Foley catheter / IV  sites and CVC at right groin intact . Profuse bleeding at buccal /. nasal cavity cntinues packed by gauze/curlex.

## 2015-07-24 NOTE — ED Notes (Signed)
PRBC [id number] 17 [id number] given

## 2015-07-24 NOTE — ED Notes (Signed)
FFP [id number] 17 [id number] given

## 2015-07-24 NOTE — Transfer of Care (Signed)
Immediate Anesthesia Transfer of Care Note  Patient: Frederick [name]  Procedure(s) Performed: Procedure(s): RADIOLOGY WITH ANESTHESIA (N/A)  Patient Location: PACU and OR 9  Anesthesia Type:General  Level of Consciousness: Patient remains intubated per anesthesia plan  Airway & Oxygen Therapy: Patient placed on Ventilator (see vital sign flow sheet for setting)  Post-op Assessment: Post -op Vital signs reviewed and stable  Post vital signs: stable  Last Vitals:  Filed Vitals:   07/24/15 0946 07/24/15 0947  BP:    Pulse:    Resp: 24 24    Complications: No apparent anesthesia complications

## 2015-07-24 NOTE — Progress Notes (Signed)
Transported patient to C.T while on the vent. Patient remained stable during transport.  

## 2015-07-24 NOTE — ED Notes (Signed)
12th unit FFP infusing , Cryo infused .
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
## 2015-07-24 NOTE — ED Notes (Signed)
PRBC [id number] 17 [id number] given

## 2015-07-24 NOTE — Transfer of Care (Signed)
Immediate Anesthesia Transfer of Care Note  Patient: Frederick [name]  Procedure(s) Performed: Procedure(s): TRACHEOSTOMY with neck exploration. (N/A) FACIAL LACERATION REPAIR (Left)  Patient Location: ICU  Anesthesia Type:General  Level of Consciousness: Patient remains intubated per anesthesia plan  Airway & Oxygen Therapy: Patient remains intubated per anesthesia plan and Patient placed on Ventilator (see vital sign flow sheet for setting)  Post-op Assessment: Report given to RN and Post -op Vital signs reviewed and stable  Post vital signs: stable  Last Vitals:  Filed Vitals:   07/24/15 0947 07/24/15 1147  BP:    Pulse:  98  Resp: 24 24    Complications: No apparent anesthesia complications

## 2015-07-24 NOTE — ED Notes (Signed)
15 unit FFP , 14th unit PRBC , 7th bag NS infusing .

## 2015-07-24 NOTE — ED Notes (Signed)
FFP [id number] 17 [id number] given

## 2015-07-25 ENCOUNTER — Inpatient Hospital Stay (HOSPITAL_COMMUNITY): Payer: Self-pay

## 2015-07-25 ENCOUNTER — Encounter (HOSPITAL_COMMUNITY): Payer: Self-pay | Admitting: Interventional Radiology

## 2015-07-25 LAB — PREPARE FRESH FROZEN PLASMA
UNIT DIVISION: 0
UNIT DIVISION: 0
UNIT DIVISION: 0
UNIT DIVISION: 0
UNIT DIVISION: 0
UNIT DIVISION: 0
UNIT DIVISION: 0
UNIT DIVISION: 0
UNIT DIVISION: 0
UNIT DIVISION: 0
UNIT DIVISION: 0
UNIT DIVISION: 0
UNIT DIVISION: 0
UNIT DIVISION: 0
UNIT DIVISION: 0
UNIT DIVISION: 0
UNIT DIVISION: 0
UNIT DIVISION: 0
UNIT DIVISION: 0
UNIT DIVISION: 0
UNIT DIVISION: 0
Unit division: 0
Unit division: 0
Unit division: 0
Unit division: 0
Unit division: 0
Unit division: 0
Unit division: 0
Unit division: 0
Unit division: 0
Unit division: 0
Unit division: 0
Unit division: 0

## 2015-07-25 LAB — PREPARE CRYOPRECIPITATE
UNIT DIVISION: 0
Unit division: 0
Unit division: 0

## 2015-07-25 LAB — PREPARE PLATELET PHERESIS
Unit division: 0
Unit division: 0
Unit division: 0

## 2015-07-25 LAB — BLOOD GAS, ARTERIAL
Acid-Base Excess: 3 mmol/L — ABNORMAL HIGH (ref 0.0–2.0)
BICARBONATE: 27.6 meq/L — AB (ref 20.0–24.0)
Drawn by: 313941
FIO2: 0.4
LHR: 24 {breaths}/min
MECHVT: 550 mL
O2 SAT: 93.1 %
PATIENT TEMPERATURE: 101.1
PCO2 ART: 50.4 mmHg — AB (ref 35.0–45.0)
PEEP/CPAP: 5 cmH2O
PO2 ART: 74.8 mmHg — AB (ref 80.0–100.0)
TCO2: 29.1 mmol/L (ref 0–100)
pH, Arterial: 7.365 (ref 7.350–7.450)

## 2015-07-25 LAB — CBC
HCT: 29.5 % — ABNORMAL LOW (ref 39.0–52.0)
HEMATOCRIT: 29.8 % — AB (ref 39.0–52.0)
HEMOGLOBIN: 10.3 g/dL — AB (ref 13.0–17.0)
Hemoglobin: 10.3 g/dL — ABNORMAL LOW (ref 13.0–17.0)
MCH: 29.1 pg (ref 26.0–34.0)
MCH: 29.2 pg (ref 26.0–34.0)
MCHC: 34.9 g/dL (ref 30.0–36.0)
MCHC: 34.6 g/dL (ref 30.0–36.0)
MCV: 83.3 fL (ref 78.0–100.0)
MCV: 84.4 fL (ref 78.0–100.0)
PLATELETS: 86 10*3/uL — AB (ref 150–400)
Platelets: 84 10*3/uL — ABNORMAL LOW (ref 150–400)
RBC: 3.54 MIL/uL — ABNORMAL LOW (ref 4.22–5.81)
RBC: 3.53 MIL/uL — ABNORMAL LOW (ref 4.22–5.81)
RDW: 14.2 % (ref 11.5–15.5)
RDW: 14.7 % (ref 11.5–15.5)
WBC: 7.5 10*3/uL (ref 4.0–10.5)
WBC: 9 10*3/uL (ref 4.0–10.5)

## 2015-07-25 LAB — CBC WITH DIFFERENTIAL/PLATELET
BASOS ABS: 0 10*3/uL (ref 0.0–0.1)
BASOS PCT: 0 %
EOS PCT: 0 %
Eosinophils Absolute: 0 10*3/uL (ref 0.0–0.7)
HEMATOCRIT: 30.2 % — AB (ref 39.0–52.0)
HEMOGLOBIN: 10.4 g/dL — AB (ref 13.0–17.0)
LYMPHS ABS: 1 10*3/uL (ref 0.7–4.0)
LYMPHS PCT: 13 %
MCH: 28.5 pg (ref 26.0–34.0)
MCHC: 34.4 g/dL (ref 30.0–36.0)
MCV: 82.7 fL (ref 78.0–100.0)
MONOS PCT: 7 %
Monocytes Absolute: 0.6 10*3/uL (ref 0.1–1.0)
NEUTROS ABS: 6.3 10*3/uL (ref 1.7–7.7)
Neutrophils Relative %: 80 %
Platelets: 91 10*3/uL — ABNORMAL LOW (ref 150–400)
RBC: 3.65 MIL/uL — ABNORMAL LOW (ref 4.22–5.81)
RDW: 14.3 % (ref 11.5–15.5)
WBC Morphology: INCREASED
WBC: 7.9 10*3/uL (ref 4.0–10.5)

## 2015-07-25 LAB — TYPE AND SCREEN
ABO/RH(D): A POS
ANTIBODY SCREEN: NEGATIVE
UNIT DIVISION: 0
Unit division: 0
Unit division: 0

## 2015-07-25 LAB — BASIC METABOLIC PANEL
Anion gap: 9 (ref 5–15)
BUN: 23 mg/dL — AB (ref 6–20)
CALCIUM: 7.9 mg/dL — AB (ref 8.9–10.3)
CHLORIDE: 108 mmol/L (ref 101–111)
CO2: 27 mmol/L (ref 22–32)
CREATININE: 1.02 mg/dL (ref 0.61–1.24)
GFR calc Af Amer: >60 mL/min (ref >60)
GFR calc non Af Amer: >60 mL/min (ref >60)
GLUCOSE: 98 mg/dL (ref 65–99)
Potassium: 3.7 mmol/L (ref 3.5–5.1)
Sodium: 144 mmol/L (ref 135–145)

## 2015-07-25 LAB — TRIGLYCERIDES: TRIGLYCERIDES: 75 mg/dL (ref <150)

## 2015-07-25 LAB — PROTIME-INR
INR: 1.38 (ref 0.00–1.49)
Prothrombin Time: 17.1 seconds — ABNORMAL HIGH (ref 11.6–15.2)

## 2015-07-25 LAB — BLOOD PRODUCT ORDER (VERBAL) VERIFICATION
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
## 2015-07-25 NOTE — Progress Notes (Signed)
Patient placed on CPAP 5/ PS 10 with an irregular pattern. Will continue to watch and if becomes more regular will then trial trach collar. RN aware.
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
## 2015-07-25 NOTE — Progress Notes (Signed)
717fr sheath removed from right groin at 1550. Manual pressure held until  1410    . Area dressed with gauze and Tegadurm. Right distal pulses present. 754fr sheath removed from left groin at 1555 and manual pressure held until  1415  . Area dressed with gauze and Tegadurm. Left distal pulses present. Both groins free of hematoma.
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
## 2015-07-26 ENCOUNTER — Encounter (HOSPITAL_COMMUNITY): Payer: Self-pay | Admitting: Otolaryngology

## 2015-07-26 ENCOUNTER — Inpatient Hospital Stay (HOSPITAL_COMMUNITY): Payer: Self-pay

## 2015-07-26 LAB — CBC WITH DIFFERENTIAL/PLATELET
BAND NEUTROPHILS: 25 %
HCT: 29 % — ABNORMAL LOW (ref 39.0–52.0)
Hemoglobin: 9.6 g/dL — ABNORMAL LOW (ref 13.0–17.0)
LYMPHS PCT: 7 %
MCH: 28.5 pg (ref 26.0–34.0)
MCHC: 33.1 g/dL (ref 30.0–36.0)
MCV: 86.1 fL (ref 78.0–100.0)
METAMYELOCYTES PCT: 5 %
Monocytes Relative: 7 %
Neutrophils Relative %: 56 %
PLATELETS: 88 10*3/uL — AB (ref 150–400)
RBC: 3.37 MIL/uL — AB (ref 4.22–5.81)
RDW: 14.2 % (ref 11.5–15.5)
WBC: 7.9 10*3/uL (ref 4.0–10.5)

## 2015-07-26 LAB — BLOOD GAS, ARTERIAL
Acid-Base Excess: 1.8 mmol/L (ref 0.0–2.0)
Bicarbonate: 25.9 mEq/L — ABNORMAL HIGH (ref 20.0–24.0)
DRAWN BY: 313941
FIO2: 0.8
MECHVT: 620 mL
O2 Saturation: 91 %
PATIENT TEMPERATURE: 101.7
PEEP: 10 cmH2O
PO2 ART: 66.8 mmHg — AB (ref 80.0–100.0)
RATE: 24 resp/min
TCO2: 27.2 mmol/L (ref 0–100)
pCO2 arterial: 44.8 mmHg (ref 35.0–45.0)
pH, Arterial: 7.39 (ref 7.350–7.450)

## 2015-07-26 LAB — BASIC METABOLIC PANEL
Anion gap: 7 (ref 5–15)
BUN: 18 mg/dL (ref 6–20)
CALCIUM: 7.9 mg/dL — AB (ref 8.9–10.3)
CO2: 26 mmol/L (ref 22–32)
CREATININE: 1.08 mg/dL (ref 0.61–1.24)
Chloride: 108 mmol/L (ref 101–111)
GFR calc Af Amer: >60 mL/min (ref >60)
GLUCOSE: 112 mg/dL — AB (ref 65–99)
Potassium: 3.8 mmol/L (ref 3.5–5.1)
Sodium: 141 mmol/L (ref 135–145)

## 2015-07-26 LAB — GLUCOSE, CAPILLARY
GLUCOSE-CAPILLARY: 118 mg/dL — AB (ref 65–99)
Glucose-Capillary: 98 mg/dL (ref 65–99)
Glucose-Capillary: 119 mg/dL — ABNORMAL HIGH (ref 65–99)

## 2015-07-26 LAB — CBC
HCT: 26.5 % — ABNORMAL LOW (ref 39.0–52.0)
Hemoglobin: 9 g/dL — ABNORMAL LOW (ref 13.0–17.0)
MCH: 28.9 pg (ref 26.0–34.0)
MCHC: 34 g/dL (ref 30.0–36.0)
MCV: 85.2 fL (ref 78.0–100.0)
PLATELETS: 84 10*3/uL — AB (ref 150–400)
RBC: 3.11 MIL/uL — ABNORMAL LOW (ref 4.22–5.81)
RDW: 13.8 % (ref 11.5–15.5)
WBC: 6.2 10*3/uL (ref 4.0–10.5)

## 2015-07-26 MED ORDER — PIPERACILLIN-TAZOBACTAM 3.375 G IVPB
3.3750 g | Freq: Three times a day (TID) | INTRAVENOUS | Status: AC
  Administered 2015-07-26 – 2015-08-01 (×20): 3.375 g via INTRAVENOUS
  Filled 2015-07-26 (×23): qty 50

## 2015-07-26 MED ORDER — PIVOT 1.5 CAL PO LIQD
1000.0000 mL | ORAL | Status: DC
  Administered 2015-07-29: 1000 mL

## 2015-07-26 MED ORDER — SODIUM CHLORIDE 0.9% FLUSH
10.0000 mL | INTRAVENOUS | Status: DC | PRN
  Administered 2015-08-01: 10 mL
  Administered 2015-08-05 (×2): 30 mL
  Filled 2015-07-26 (×3): qty 40

## 2015-07-26 MED ORDER — ALBUMIN HUMAN 5 % IV SOLN
25.0000 g | Freq: Once | INTRAVENOUS | Status: AC
  Administered 2015-07-26: 25 g via INTRAVENOUS
  Filled 2015-07-26: qty 250

## 2015-07-26 MED ORDER — VITAMIN C 500 MG PO TABS
1000.0000 mg | ORAL_TABLET | Freq: Three times a day (TID) | ORAL | Status: DC
  Administered 2015-07-26 – 2015-07-31 (×13): 1000 mg
  Filled 2015-07-26 (×15): qty 2

## 2015-07-26 MED ORDER — SODIUM CHLORIDE 0.9 % IV SOLN: INTRAVENOUS | Status: DC | PRN

## 2015-07-26 MED ORDER — IPRATROPIUM-ALBUTEROL 0.5-2.5 (3) MG/3ML IN SOLN
3.0000 mL | Freq: Four times a day (QID) | RESPIRATORY_TRACT | Status: DC
  Administered 2015-07-26 – 2015-08-01 (×27): 3 mL via RESPIRATORY_TRACT
  Filled 2015-07-26 (×26): qty 3

## 2015-07-26 MED ORDER — SODIUM CHLORIDE 0.9% FLUSH
10.0000 mL | Freq: Two times a day (BID) | INTRAVENOUS | Status: DC
  Administered 2015-07-26 – 2015-07-29 (×5): 10 mL
  Administered 2015-07-30: 30 mL
  Administered 2015-07-31 – 2015-08-02 (×4): 10 mL
  Administered 2015-08-02 – 2015-08-04 (×4): 20 mL

## 2015-07-26 MED ORDER — PIVOT 1.5 CAL PO LIQD
1000.0000 mL | ORAL | Status: DC
  Administered 2015-07-26: 1000 mL

## 2015-07-26 MED ORDER — ALBUMIN HUMAN 5 % IV SOLN
INTRAVENOUS | Status: AC
  Filled 2015-07-26: qty 250

## 2015-07-26 MED ORDER — SELENIUM 50 MCG PO TABS
200.0000 ug | ORAL_TABLET | Freq: Every day | ORAL | Status: DC
  Administered 2015-07-26 – 2015-07-31 (×6): 200 ug
  Filled 2015-07-26 (×7): qty 4

## 2015-07-26 NOTE — Addendum Note (Signed)
Addendum  created 07/26/15 1457 by Shelton SilvasKevin D Nita Whitmire, MD   Modules edited: Anesthesia Events, Narrator   Narrator:  Narrator: Event Log Edited
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
## 2015-07-26 NOTE — Progress Notes (Signed)
Peripherally Inserted Central Catheter/Midline Placement  The IV Nurse has discussed with the patient and/or persons authorized to consent for the patient, the purpose of this procedure and the potential benefits and risks involved with this procedure.  The benefits include less needle sticks, lab draws from the catheter and patient may be discharged home with the catheter.  Risks include, but not limited to, infection, bleeding, blood clot (thrombus formation), and puncture of an artery; nerve damage and irregular heat beat.  Alternatives to this procedure were also discussed.  Consent obtained from mother via [employer] [phone]  PICC/Midline Placement Documentation        Arti Trang, Lajean ManesKerry Loraine 07/26/2015, 6:54 PM

## 2015-07-26 NOTE — Procedures (Signed)
Arterial Catheter Insertion Procedure Note Frederick [name] Frederick [name] [id number] 07/09/1985  Procedure: Insertion of Arterial Catheter  Indications: Blood pressure monitoring and Frequent blood sampling  Procedure Details Consent: Unable to obtain consent because of altered level of consciousness. Time Out: Verified patient identification, verified procedure, site/side was marked, verified correct patient position, special equipment/implants available, medications/allergies/relevent history reviewed, required imaging and test results available.  Performed  Maximum sterile technique was used including antiseptics, cap, gloves, gown, hand hygiene, mask and sheet. Skin prep: Chlorhexidine; local anesthetic administered 20 gauge catheter was inserted into left radial artery using the Seldinger technique.  Evaluation Blood flow good; BP tracing good. Complications: No apparent complications.   Ancil BoozerSmallwood, Jeananne Bedwell 07/26/2015

## 2015-07-26 NOTE — Progress Notes (Signed)
Patient ID: Frederick [name], male   DOB: 11/19/1985, 30 y.o.   MRN: [id number] I updated his mother at the bedside and answered her questions. Violeta GelinasBurke Veer Elamin, MD, MPH, FACS Trauma: [phone] General Surgery: [phone]
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
## 2015-07-26 NOTE — Progress Notes (Signed)
Sputum sample obtained and sent to lab by RT. 

## 2015-07-27 ENCOUNTER — Inpatient Hospital Stay (HOSPITAL_COMMUNITY): Payer: Self-pay

## 2015-07-27 LAB — BASIC METABOLIC PANEL
ANION GAP: 8 (ref 5–15)
BUN: 13 mg/dL (ref 6–20)
CHLORIDE: 107 mmol/L (ref 101–111)
CO2: 24 mmol/L (ref 22–32)
Calcium: 8 mg/dL — ABNORMAL LOW (ref 8.9–10.3)
Creatinine, Ser: 0.94 mg/dL (ref 0.61–1.24)
GFR calc Af Amer: >60 mL/min (ref >60)
GFR calc non Af Amer: >60 mL/min (ref >60)
GLUCOSE: 145 mg/dL — AB (ref 65–99)
POTASSIUM: 3.1 mmol/L — AB (ref 3.5–5.1)
Sodium: 139 mmol/L (ref 135–145)

## 2015-07-27 LAB — CBC
HCT: 24.6 % — ABNORMAL LOW (ref 39.0–52.0)
HEMATOCRIT: 22.2 % — AB (ref 39.0–52.0)
HEMOGLOBIN: 8.4 g/dL — AB (ref 13.0–17.0)
Hemoglobin: 7.9 g/dL — ABNORMAL LOW (ref 13.0–17.0)
MCH: 30.4 pg (ref 26.0–34.0)
MCH: 28.8 pg (ref 26.0–34.0)
MCHC: 35.6 g/dL (ref 30.0–36.0)
MCHC: 34.1 g/dL (ref 30.0–36.0)
MCV: 85.4 fL (ref 78.0–100.0)
MCV: 84.2 fL (ref 78.0–100.0)
PLATELETS: 108 10*3/uL — AB (ref 150–400)
Platelets: 80 10*3/uL — ABNORMAL LOW (ref 150–400)
RBC: 2.6 MIL/uL — ABNORMAL LOW (ref 4.22–5.81)
RBC: 2.92 MIL/uL — AB (ref 4.22–5.81)
RDW: 13.9 % (ref 11.5–15.5)
RDW: 13.8 % (ref 11.5–15.5)
WBC: 4.7 10*3/uL (ref 4.0–10.5)
WBC: 6.7 10*3/uL (ref 4.0–10.5)

## 2015-07-27 LAB — GLUCOSE, CAPILLARY
GLUCOSE-CAPILLARY: 112 mg/dL — AB (ref 65–99)
GLUCOSE-CAPILLARY: 105 mg/dL — AB (ref 65–99)
GLUCOSE-CAPILLARY: 98 mg/dL (ref 65–99)
GLUCOSE-CAPILLARY: 91 mg/dL (ref 65–99)
Glucose-Capillary: 115 mg/dL — ABNORMAL HIGH (ref 65–99)
Glucose-Capillary: 126 mg/dL — ABNORMAL HIGH (ref 65–99)

## 2015-07-27 LAB — POCT I-STAT 3, ART BLOOD GAS (G3+)
ACID-BASE EXCESS: 4 mmol/L — AB (ref 0.0–2.0)
BICARBONATE: 27.6 meq/L — AB (ref 20.0–24.0)
O2 SAT: 91 %
PCO2 ART: 38.9 mmHg (ref 35.0–45.0)
PH ART: 7.463 — AB (ref 7.350–7.450)
PO2 ART: 60 mmHg — AB (ref 80.0–100.0)
Patient temperature: 38.1
TCO2: 29 mmol/L (ref 0–100)

## 2015-07-27 MED ORDER — POTASSIUM CHLORIDE 10 MEQ/50ML IV SOLN
10.0000 meq | INTRAVENOUS | Status: AC
  Administered 2015-07-27 (×4): 10 meq via INTRAVENOUS
  Filled 2015-07-27 (×4): qty 50

## 2015-07-27 NOTE — Progress Notes (Signed)
CXR reviewed regarding PICC.  PICC placed with ECG technology.  IV team good with placement and PICC left in current position as per current policy.  Will continue to monitor.
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
## 2015-07-28 LAB — TYPE AND SCREEN
ABO/RH(D): A POS
Antibody Screen: NEGATIVE
UNIT DIVISION: 0
UNIT DIVISION: 0
UNIT DIVISION: 0
UNIT DIVISION: 0
UNIT DIVISION: 0
UNIT DIVISION: 0
UNIT DIVISION: 0
UNIT DIVISION: 0
UNIT DIVISION: 0
UNIT DIVISION: 0
UNIT DIVISION: 0
UNIT DIVISION: 0
UNIT DIVISION: 0
UNIT DIVISION: 0
UNIT DIVISION: 0
UNIT DIVISION: 0
UNIT DIVISION: 0
UNIT DIVISION: 0
UNIT DIVISION: 0
UNIT DIVISION: 0
UNIT DIVISION: 0
Unit division: 0
Unit division: 0
Unit division: 0
Unit division: 0
Unit division: 0
Unit division: 0
Unit division: 0
Unit division: 0
Unit division: 0
Unit division: 0
Unit division: 0
Unit division: 0
Unit division: 0
Unit division: 0

## 2015-07-28 LAB — GLUCOSE, CAPILLARY
GLUCOSE-CAPILLARY: 88 mg/dL (ref 65–99)
Glucose-Capillary: 85 mg/dL (ref 65–99)
Glucose-Capillary: 85 mg/dL (ref 65–99)
Glucose-Capillary: 90 mg/dL (ref 65–99)
Glucose-Capillary: 99 mg/dL (ref 65–99)

## 2015-07-28 LAB — CBC
HEMATOCRIT: 22.9 % — AB (ref 39.0–52.0)
Hemoglobin: 7.8 g/dL — ABNORMAL LOW (ref 13.0–17.0)
MCH: 28.8 pg (ref 26.0–34.0)
MCHC: 34.1 g/dL (ref 30.0–36.0)
MCV: 84.5 fL (ref 78.0–100.0)
Platelets: 127 10*3/uL — ABNORMAL LOW (ref 150–400)
RBC: 2.71 MIL/uL — ABNORMAL LOW (ref 4.22–5.81)
RDW: 13.7 % (ref 11.5–15.5)
WBC: 5.7 10*3/uL (ref 4.0–10.5)

## 2015-07-28 LAB — POCT I-STAT 3, ART BLOOD GAS (G3+)
ACID-BASE DEFICIT: 1 mmol/L (ref 0.0–2.0)
BICARBONATE: 22.8 meq/L (ref 20.0–24.0)
O2 SAT: 99 %
PO2 ART: 131 mmHg — AB (ref 80.0–100.0)
Patient temperature: 98.7
TCO2: 24 mmol/L (ref 0–100)
pCO2 arterial: 32.7 mmHg — ABNORMAL LOW (ref 35.0–45.0)
pH, Arterial: 7.452 — ABNORMAL HIGH (ref 7.350–7.450)

## 2015-07-28 LAB — BASIC METABOLIC PANEL
Anion gap: 8 (ref 5–15)
BUN: 8 mg/dL (ref 6–20)
CHLORIDE: 108 mmol/L (ref 101–111)
CO2: 22 mmol/L (ref 22–32)
CREATININE: 0.86 mg/dL (ref 0.61–1.24)
Calcium: 8.4 mg/dL — ABNORMAL LOW (ref 8.9–10.3)
GFR calc Af Amer: >60 mL/min (ref >60)
GFR calc non Af Amer: >60 mL/min (ref >60)
GLUCOSE: 114 mg/dL — AB (ref 65–99)
POTASSIUM: 3.2 mmol/L — AB (ref 3.5–5.1)
Sodium: 138 mmol/L (ref 135–145)

## 2015-07-28 LAB — TRIGLYCERIDES: Triglycerides: 227 mg/dL — ABNORMAL HIGH (ref <150)

## 2015-07-28 MED ORDER — BETHANECHOL CHLORIDE 10 MG PO TABS
25.0000 mg | ORAL_TABLET | Freq: Three times a day (TID) | ORAL | Status: DC
  Administered 2015-07-28 – 2015-07-31 (×10): 25 mg
  Filled 2015-07-28 (×10): qty 3

## 2015-07-28 MED ORDER — POTASSIUM CHLORIDE 10 MEQ/50ML IV SOLN
10.0000 meq | INTRAVENOUS | Status: AC
  Administered 2015-07-28 (×3): 10 meq via INTRAVENOUS
  Filled 2015-07-28 (×3): qty 50
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
## 2015-07-29 ENCOUNTER — Inpatient Hospital Stay (HOSPITAL_COMMUNITY): Payer: Self-pay

## 2015-07-29 LAB — BLOOD GAS, ARTERIAL
Acid-base deficit: 0.8 mmol/L (ref 0.0–2.0)
Bicarbonate: 22.6 mEq/L (ref 20.0–24.0)
DRAWN BY: 398661
FIO2: 0.4
LHR: 20 {breaths}/min
MECHVT: 620 mL
O2 Saturation: 98 %
PATIENT TEMPERATURE: 98.6
PCO2 ART: 32.1 mmHg — AB (ref 35.0–45.0)
PEEP: 5 cmH2O
PO2 ART: 113 mmHg — AB (ref 80.0–100.0)
TCO2: 23.5 mmol/L (ref 0–100)
pH, Arterial: 7.46 — ABNORMAL HIGH (ref 7.350–7.450)

## 2015-07-29 LAB — BASIC METABOLIC PANEL
Anion gap: 10 (ref 5–15)
BUN: 7 mg/dL (ref 6–20)
CALCIUM: 8.5 mg/dL — AB (ref 8.9–10.3)
CHLORIDE: 104 mmol/L (ref 101–111)
CO2: 23 mmol/L (ref 22–32)
CREATININE: 0.82 mg/dL (ref 0.61–1.24)
GFR calc non Af Amer: >60 mL/min (ref >60)
Glucose, Bld: 108 mg/dL — ABNORMAL HIGH (ref 65–99)
Potassium: 3.1 mmol/L — ABNORMAL LOW (ref 3.5–5.1)
SODIUM: 137 mmol/L (ref 135–145)

## 2015-07-29 LAB — CBC
HCT: 25.7 % — ABNORMAL LOW (ref 39.0–52.0)
HEMOGLOBIN: 8.6 g/dL — AB (ref 13.0–17.0)
MCH: 28.5 pg (ref 26.0–34.0)
MCHC: 33.5 g/dL (ref 30.0–36.0)
MCV: 85.1 fL (ref 78.0–100.0)
Platelets: 169 10*3/uL (ref 150–400)
RBC: 3.02 MIL/uL — ABNORMAL LOW (ref 4.22–5.81)
RDW: 13.8 % (ref 11.5–15.5)
WBC: 6 10*3/uL (ref 4.0–10.5)

## 2015-07-29 LAB — GLUCOSE, CAPILLARY
GLUCOSE-CAPILLARY: 96 mg/dL (ref 65–99)
Glucose-Capillary: 94 mg/dL (ref 65–99)
Glucose-Capillary: 95 mg/dL (ref 65–99)
Glucose-Capillary: 102 mg/dL — ABNORMAL HIGH (ref 65–99)
Glucose-Capillary: 106 mg/dL — ABNORMAL HIGH (ref 65–99)

## 2015-07-29 MED ORDER — BISACODYL 10 MG RE SUPP
10.0000 mg | Freq: Every day | RECTAL | Status: DC | PRN
  Administered 2015-07-29 – 2015-07-30 (×2): 10 mg via RECTAL
  Filled 2015-07-29 (×3): qty 1

## 2015-07-29 MED ORDER — ENOXAPARIN SODIUM 40 MG/0.4ML ~~LOC~~ SOLN
40.0000 mg | SUBCUTANEOUS | Status: DC
  Administered 2015-07-29 – 2015-07-31 (×3): 40 mg via SUBCUTANEOUS
  Filled 2015-07-29 (×3): qty 0.4
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
## 2015-07-30 ENCOUNTER — Inpatient Hospital Stay (HOSPITAL_COMMUNITY): Payer: Self-pay

## 2015-07-30 LAB — GLUCOSE, CAPILLARY
GLUCOSE-CAPILLARY: 123 mg/dL — AB (ref 65–99)
GLUCOSE-CAPILLARY: 107 mg/dL — AB (ref 65–99)
GLUCOSE-CAPILLARY: 108 mg/dL — AB (ref 65–99)
GLUCOSE-CAPILLARY: 96 mg/dL (ref 65–99)
Glucose-Capillary: 103 mg/dL — ABNORMAL HIGH (ref 65–99)
Glucose-Capillary: 118 mg/dL — ABNORMAL HIGH (ref 65–99)
Glucose-Capillary: 119 mg/dL — ABNORMAL HIGH (ref 65–99)
Glucose-Capillary: 82 mg/dL (ref 65–99)
Glucose-Capillary: 95 mg/dL (ref 65–99)

## 2015-07-30 LAB — CULTURE, RESPIRATORY W GRAM STAIN

## 2015-07-30 LAB — BASIC METABOLIC PANEL
ANION GAP: 12 (ref 5–15)
Anion gap: 11 (ref 5–15)
BUN: 57 mg/dL — ABNORMAL HIGH (ref 6–20)
BUN: 10 mg/dL (ref 6–20)
CALCIUM: 8.8 mg/dL — AB (ref 8.9–10.3)
CHLORIDE: 116 mmol/L — AB (ref 101–111)
CHLORIDE: 105 mmol/L (ref 101–111)
CO2: 27 mmol/L (ref 22–32)
CO2: 25 mmol/L (ref 22–32)
CREATININE: 0.77 mg/dL (ref 0.61–1.24)
Calcium: 8.4 mg/dL — ABNORMAL LOW (ref 8.9–10.3)
Creatinine, Ser: 2.18 mg/dL — ABNORMAL HIGH (ref 0.61–1.24)
GFR calc non Af Amer: 40 mL/min — ABNORMAL LOW (ref >60)
GFR calc non Af Amer: >60 mL/min (ref >60)
GFR, EST AFRICAN AMERICAN: 46 mL/min — AB (ref >60)
Glucose, Bld: 168 mg/dL — ABNORMAL HIGH (ref 65–99)
Glucose, Bld: 113 mg/dL — ABNORMAL HIGH (ref 65–99)
Potassium: 2.9 mmol/L — ABNORMAL LOW (ref 3.5–5.1)
Potassium: 3.9 mmol/L (ref 3.5–5.1)
Sodium: 155 mmol/L — ABNORMAL HIGH (ref 135–145)
Sodium: 141 mmol/L (ref 135–145)

## 2015-07-30 LAB — CBC
HEMATOCRIT: 32.9 % — AB (ref 39.0–52.0)
HEMOGLOBIN: 10.2 g/dL — AB (ref 13.0–17.0)
MCH: 22.8 pg — ABNORMAL LOW (ref 26.0–34.0)
MCHC: 31 g/dL (ref 30.0–36.0)
MCV: 73.4 fL — AB (ref 78.0–100.0)
Platelets: 221 10*3/uL (ref 150–400)
RBC: 4.48 MIL/uL (ref 4.22–5.81)
RDW: 21.1 % — ABNORMAL HIGH (ref 11.5–15.5)
WBC: 13.5 10*3/uL — ABNORMAL HIGH (ref 4.0–10.5)

## 2015-07-30 LAB — CULTURE, RESPIRATORY: SPECIAL REQUESTS: NORMAL

## 2015-07-30 MED ORDER — POTASSIUM CHLORIDE 10 MEQ/50ML IV SOLN
10.0000 meq | INTRAVENOUS | Status: AC
  Administered 2015-07-30 (×4): 10 meq via INTRAVENOUS
  Filled 2015-07-30 (×4): qty 50

## 2015-07-30 MED ORDER — FREE WATER
200.0000 mL | Freq: Three times a day (TID) | Status: DC
  Administered 2015-07-30 – 2015-07-31 (×4): 200 mL

## 2015-07-30 MED ORDER — ALTEPLASE 100 MG IV SOLR
2.0000 mg | Freq: Once | INTRAVENOUS | Status: AC
  Administered 2015-07-30: 2 mg
  Filled 2015-07-30: qty 2

## 2015-07-30 MED ORDER — LORAZEPAM 2 MG/ML IJ SOLN
1.0000 mg | INTRAMUSCULAR | Status: DC | PRN
  Administered 2015-07-30 – 2015-08-01 (×3): 2 mg via INTRAVENOUS
  Administered 2015-08-01: 1 mg via INTRAVENOUS
  Filled 2015-07-30 (×4): qty 1

## 2015-07-30 MED ORDER — DEXTROSE-NACL 5-0.2 % IV SOLN
INTRAVENOUS | Status: DC
  Administered 2015-07-30: 16:00:00 via INTRAVENOUS
  Filled 2015-07-30 (×5): qty 1000
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
## 2015-07-31 ENCOUNTER — Encounter (HOSPITAL_COMMUNITY): Payer: Self-pay | Admitting: *Deleted

## 2015-07-31 LAB — GLUCOSE, CAPILLARY
GLUCOSE-CAPILLARY: 124 mg/dL — AB (ref 65–99)
GLUCOSE-CAPILLARY: 93 mg/dL (ref 65–99)
GLUCOSE-CAPILLARY: 98 mg/dL (ref 65–99)
GLUCOSE-CAPILLARY: 99 mg/dL (ref 65–99)
Glucose-Capillary: 121 mg/dL — ABNORMAL HIGH (ref 65–99)
Glucose-Capillary: 91 mg/dL (ref 65–99)

## 2015-07-31 LAB — BASIC METABOLIC PANEL
Anion gap: 11 (ref 5–15)
BUN: 16 mg/dL (ref 6–20)
CHLORIDE: 99 mmol/L — AB (ref 101–111)
CO2: 26 mmol/L (ref 22–32)
CREATININE: 0.79 mg/dL (ref 0.61–1.24)
Calcium: 8.4 mg/dL — ABNORMAL LOW (ref 8.9–10.3)
GFR calc Af Amer: >60 mL/min (ref >60)
GLUCOSE: 132 mg/dL — AB (ref 65–99)
Potassium: 3.5 mmol/L (ref 3.5–5.1)
SODIUM: 136 mmol/L (ref 135–145)

## 2015-07-31 LAB — HEMOGLOBIN AND HEMATOCRIT, BLOOD
HEMATOCRIT: 30.1 % — AB (ref 39.0–52.0)
Hemoglobin: 9.9 g/dL — ABNORMAL LOW (ref 13.0–17.0)

## 2015-07-31 LAB — CBC
HCT: 27.9 % — ABNORMAL LOW (ref 39.0–52.0)
HEMOGLOBIN: 9.5 g/dL — AB (ref 13.0–17.0)
MCH: 29.5 pg (ref 26.0–34.0)
MCHC: 34.1 g/dL (ref 30.0–36.0)
MCV: 86.6 fL (ref 78.0–100.0)
PLATELETS: 409 10*3/uL — AB (ref 150–400)
RBC: 3.22 MIL/uL — AB (ref 4.22–5.81)
RDW: 13.9 % (ref 11.5–15.5)
WBC: 9.6 10*3/uL (ref 4.0–10.5)

## 2015-07-31 LAB — TRIGLYCERIDES: TRIGLYCERIDES: 119 mg/dL (ref <150)

## 2015-07-31 LAB — CDS SEROLOGY

## 2015-07-31 MED ORDER — SODIUM CHLORIDE 0.9 % IV SOLN
8.0000 mg/h | INTRAVENOUS | Status: AC
  Administered 2015-07-31 – 2015-08-03 (×7): 8 mg/h via INTRAVENOUS
  Filled 2015-07-31 (×13): qty 80

## 2015-07-31 MED ORDER — PNEUMOCOCCAL VAC POLYVALENT 25 MCG/0.5ML IJ INJ
0.5000 mL | INJECTION | INTRAMUSCULAR | Status: AC
  Administered 2015-08-01: 0.5 mL via INTRAMUSCULAR
  Filled 2015-07-31: qty 0.5

## 2015-07-31 MED ORDER — SODIUM CHLORIDE 0.9 % IV SOLN
INTRAVENOUS | Status: DC
  Filled 2015-07-31: qty 1000

## 2015-07-31 MED ORDER — SODIUM CHLORIDE 0.9 % IV SOLN
80.0000 mg | Freq: Once | INTRAVENOUS | Status: AC
  Administered 2015-07-31: 80 mg via INTRAVENOUS
  Filled 2015-07-31: qty 80

## 2015-07-31 NOTE — Progress Notes (Signed)
Informed Dr. Suszanne Connerseoh regarding patient throwing up earlier today after cuff was deflated.  No orders received at this.  He will stop by to see patient in the AM.  Will continue to monitor patient.
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
## 2015-07-31 NOTE — Progress Notes (Signed)
Patients cuff re-inflated due to patient vomiting. RN and MD aware.

## 2015-07-31 NOTE — Progress Notes (Signed)
Notified Dr. Magnus IvanBlackman regarding patient vomiting feeding supplement and medicines.  Order received to remove OG tube.  Patient was also given 4mg  Zofran IV and is now resting comfortably.  Will continue to monitor patient.
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
## 2015-07-31 NOTE — Progress Notes (Signed)
Pt placed back on the vent from trach collar due to continued vomiting from mouth and trach. Pt tachypneic with increased work of breathing and desat to 83%. Pt tolerating vent well with respiratory status improving. RT and RN at bedside, MD paged. RT will continue to monitor.
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
## 2015-08-01 ENCOUNTER — Inpatient Hospital Stay (HOSPITAL_COMMUNITY): Payer: Self-pay

## 2015-08-01 DIAGNOSIS — S270XXA Traumatic pneumothorax, initial encounter: Secondary | ICD-10-CM | POA: Diagnosis present

## 2015-08-01 DIAGNOSIS — S15009A Unspecified injury of unspecified carotid artery, initial encounter: Secondary | ICD-10-CM | POA: Diagnosis present

## 2015-08-01 DIAGNOSIS — S129XXA Fracture of neck, unspecified, initial encounter: Secondary | ICD-10-CM | POA: Diagnosis present

## 2015-08-01 DIAGNOSIS — J9600 Acute respiratory failure, unspecified whether with hypoxia or hypercapnia: Secondary | ICD-10-CM | POA: Diagnosis present

## 2015-08-01 DIAGNOSIS — J189 Pneumonia, unspecified organism: Secondary | ICD-10-CM | POA: Diagnosis not present

## 2015-08-01 DIAGNOSIS — S1985XA Other specified injuries of pharynx and cervical esophagus, initial encounter: Secondary | ICD-10-CM | POA: Diagnosis present

## 2015-08-01 DIAGNOSIS — R339 Retention of urine, unspecified: Secondary | ICD-10-CM | POA: Diagnosis present

## 2015-08-01 DIAGNOSIS — D62 Acute posthemorrhagic anemia: Secondary | ICD-10-CM | POA: Diagnosis not present

## 2015-08-01 LAB — GLUCOSE, CAPILLARY
GLUCOSE-CAPILLARY: 104 mg/dL — AB (ref 65–99)
Glucose-Capillary: 119 mg/dL — ABNORMAL HIGH (ref 65–99)
Glucose-Capillary: 104 mg/dL — ABNORMAL HIGH (ref 65–99)
Glucose-Capillary: 103 mg/dL — ABNORMAL HIGH (ref 65–99)
Glucose-Capillary: 128 mg/dL — ABNORMAL HIGH (ref 65–99)

## 2015-08-01 LAB — HEMOGLOBIN AND HEMATOCRIT, BLOOD
HCT: 28.6 % — ABNORMAL LOW (ref 39.0–52.0)
Hemoglobin: 9.4 g/dL — ABNORMAL LOW (ref 13.0–17.0)

## 2015-08-01 MED ORDER — ALTEPLASE 2 MG IJ SOLR
4.0000 mg | Freq: Once | INTRAMUSCULAR | Status: AC
  Administered 2015-08-01: 4 mg
  Filled 2015-08-01: qty 4

## 2015-08-01 MED ORDER — HALOPERIDOL LACTATE 5 MG/ML IJ SOLN: 5.0000 mg | Freq: Once | INTRAMUSCULAR | Status: AC

## 2015-08-01 MED ORDER — MORPHINE SULFATE (PF) 2 MG/ML IV SOLN
2.0000 mg | INTRAVENOUS | Status: DC | PRN
  Administered 2015-08-01 – 2015-08-02 (×2): 2 mg via INTRAVENOUS
  Administered 2015-08-05: 4 mg via INTRAVENOUS
  Filled 2015-08-01: qty 1
  Filled 2015-08-01: qty 2
  Filled 2015-08-01: qty 1

## 2015-08-01 MED ORDER — HALOPERIDOL LACTATE 5 MG/ML IJ SOLN
INTRAMUSCULAR | Status: AC
  Administered 2015-08-01: 5 mg
  Filled 2015-08-01: qty 1

## 2015-08-01 MED ORDER — IOPAMIDOL (ISOVUE-300) INJECTION 61%
150.0000 mL | Freq: Once | INTRAVENOUS | Status: AC | PRN
  Administered 2015-08-01: 20 mL

## 2015-08-01 MED ORDER — LORAZEPAM 2 MG/ML IJ SOLN
1.0000 mg | Freq: Once | INTRAMUSCULAR | Status: AC
  Administered 2015-08-01: 1 mg via INTRAVENOUS
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
## 2015-08-01 NOTE — Progress Notes (Signed)
Wasted 100ml of Fentanyl, 6410mcg/ml, in the sink.  Almond LintScott Crofts, RN witnessed. Karlin Binion C 08/01/2015

## 2015-08-01 NOTE — Plan of Care (Signed)
Problem: Phase II Progression Outcomes Goal: Progress towards Cuffless Trach or Decannulation Outcome: Completed/Met Date Met:  08/01/15 Trach removed 4/12 at 0815
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
## 2015-08-02 ENCOUNTER — Inpatient Hospital Stay (HOSPITAL_COMMUNITY): Payer: Self-pay

## 2015-08-02 LAB — CBC
HCT: 30.5 % — ABNORMAL LOW (ref 39.0–52.0)
HEMOGLOBIN: 9.8 g/dL — AB (ref 13.0–17.0)
MCH: 28.1 pg (ref 26.0–34.0)
MCHC: 32.1 g/dL (ref 30.0–36.0)
MCV: 87.4 fL (ref 78.0–100.0)
Platelets: 743 10*3/uL — ABNORMAL HIGH (ref 150–400)
RBC: 3.49 MIL/uL — ABNORMAL LOW (ref 4.22–5.81)
RDW: 13.7 % (ref 11.5–15.5)
WBC: 13.7 10*3/uL — AB (ref 4.0–10.5)

## 2015-08-02 LAB — GLUCOSE, CAPILLARY: GLUCOSE-CAPILLARY: 105 mg/dL — AB (ref 65–99)

## 2015-08-02 MED ORDER — TAMSULOSIN HCL 0.4 MG PO CAPS: 0.4000 mg | ORAL_CAPSULE | Freq: Every day | ORAL | Status: DC

## 2015-08-02 MED ORDER — BETHANECHOL CHLORIDE 25 MG PO TABS: 25.0000 mg | ORAL_TABLET | Freq: Three times a day (TID) | ORAL | Status: DC

## 2015-08-02 MED ORDER — PROCHLORPERAZINE EDISYLATE 5 MG/ML IJ SOLN
10.0000 mg | INTRAMUSCULAR | Status: DC | PRN
  Filled 2015-08-02: qty 2

## 2015-08-02 MED ORDER — IPRATROPIUM-ALBUTEROL 0.5-2.5 (3) MG/3ML IN SOLN
3.0000 mL | Freq: Two times a day (BID) | RESPIRATORY_TRACT | Status: DC
  Administered 2015-08-02 (×2): 3 mL via RESPIRATORY_TRACT
  Filled 2015-08-02 (×4): qty 3

## 2015-08-02 MED ORDER — LORAZEPAM 2 MG/ML IJ SOLN
1.0000 mg | INTRAMUSCULAR | Status: DC | PRN
  Administered 2015-08-02: 1 mg via INTRAVENOUS
  Filled 2015-08-02: qty 1

## 2015-08-02 NOTE — Progress Notes (Signed)
Patient ID: Frederick [name], male   DOB: 11/09/1985, 30 y.o.   MRN: [id number] During MBS aspirated barium pill then coughed it out. He then vomited and aspirated some of that material. Initial SOB. Now sats 100% on [place] O2. Lungs clear. Will check CXR. Results reviewed with speech therapy. They feel he will be able to take clears in a day or two and should improve for solids once edema goes down. ,betsi

## 2015-08-02 NOTE — Significant Event (Signed)
Rapid Response Event Note  Overview:  Paged rapid response call to radiology Time Called: 1345 Arrival Time: 1347 Event Type: Respiratory  Initial Focused Assessment:  On arrival patient sitting upright in swallow study chair - awake and alert - hot to touch - diaphoretic - follows commands - denies pain -some SOB endorsed - according to staff was decannulated trach yesterday - speech staff report he aspirated the barium pill - coughed it up - then vomited and aspirated - patient has trach stoma - open and moving air thru trach and upper airway - O2 sats 98% - rapid resps - moving good air bilaterally - able to talk and communicate - some upper airway wheezing noted - RT Katie present at bedside.     Interventions:  Reassured patient to relax - his airway is patent - moving good air - coughing up some thick yellow mucus - HR 133 RR 32 - placed on nasal cannula.  Spoke with Spero GeraldsKenyetta RN from SDU - she is paging trauma.  Airway remains patent - transferred back to 3S02 - on arrival patient again with some upper airway wheezing and moderate distress - sats remain stable - head positioned for tracheal stoma to be open and clear - blow by O2 with ambu bag mask - patient relaxing some.  HR 119 RR24.  Nelva NayMichaeal Jeffries PA and Dr. Janee Mornhompson at bedside.  Placed back on nasal cannula. Handoff to Weyerhaeuser CompanyKenyetta RN.  To call as needed.  HR 109 RR 26 O2 sats 100% on nasal cannula - alert and resting.  PCXR done.   Event Summary: Name of Physician Notified: Dr. Doylene Canardhompson   Michael Jeffrey PA at  ([id number])    at    Outcome: Stayed in room and stabalized  Event End Time: 1410  Delton PrairieBritt, Navy Rothschild L
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
## 2015-08-02 NOTE — Care Management Note (Signed)
Case Management Note  Patient Details  Name: Frederick [name] MRN: [id number] Date of Birth: 05/24/1985  Subjective/Objective:  Patient lives with parents, NCM will cont to follow for dc needs.                    Action/Plan: 4/12 Trach decannulation 4/13 MBS completed, pt vomited and aspirated during test, now NPO Per SLP pt should be able to swallow liquids once swelling goes down.    Expected Discharge Date:                  Expected Discharge Plan:  IP Rehab Facility  In-House Referral:     Discharge planning Services  CM Consult  Post Acute Care Choice:    Choice offered to:     DME Arranged:    DME Agency:     HH Arranged:    HH Agency:     Status of Service:  In process, will continue to follow  Medicare Important Message Given:    Date Medicare IM Given:    Medicare IM give by:    Date Additional Medicare IM Given:    Additional Medicare Important Message give by:     If discussed at Long Length of Stay Meetings, dates discussed:    Additional Comments:  Leone Havenaylor, Lavell Ridings [place], RN 08/02/2015, 4:31 PM
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
## 2015-08-02 NOTE — Progress Notes (Signed)
MBSS complete. Full report located under chart review in imaging section. Rakwon Letourneau, MA CCC-SLP [phone]  
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
## 2015-08-02 NOTE — Progress Notes (Signed)
PICC dressing changed by this RN. Assisted by Pierre BaliMaya Hardin, RN. Maximum sterile technique maintained throughout procedure. Pt tolerated well; will continue to monitor.
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
## 2015-08-03 LAB — CBC
HCT: 31.7 % — ABNORMAL LOW (ref 39.0–52.0)
Hemoglobin: 10.3 g/dL — ABNORMAL LOW (ref 13.0–17.0)
MCH: 28.5 pg (ref 26.0–34.0)
MCHC: 32.5 g/dL (ref 30.0–36.0)
MCV: 87.8 fL (ref 78.0–100.0)
PLATELETS: 877 10*3/uL — AB (ref 150–400)
RBC: 3.61 MIL/uL — AB (ref 4.22–5.81)
RDW: 13.6 % (ref 11.5–15.5)
WBC: 17 10*3/uL — AB (ref 4.0–10.5)

## 2015-08-03 LAB — TRIGLYCERIDES: Triglycerides: 134 mg/dL (ref <150)
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
## 2015-08-04 ENCOUNTER — Encounter (HOSPITAL_COMMUNITY): Payer: Self-pay | Admitting: *Deleted

## 2015-08-04 ENCOUNTER — Inpatient Hospital Stay (HOSPITAL_COMMUNITY): Payer: Self-pay

## 2015-08-04 LAB — URINALYSIS, ROUTINE W REFLEX MICROSCOPIC
BILIRUBIN URINE: NEGATIVE
GLUCOSE, UA: NEGATIVE mg/dL
HGB URINE DIPSTICK: NEGATIVE
Ketones, ur: NEGATIVE mg/dL
Leukocytes, UA: NEGATIVE
Nitrite: NEGATIVE
PROTEIN: NEGATIVE mg/dL
Specific Gravity, Urine: 1.027 (ref 1.005–1.030)
pH: 6 (ref 5.0–8.0)

## 2015-08-04 MED ORDER — IPRATROPIUM-ALBUTEROL 0.5-2.5 (3) MG/3ML IN SOLN: 3.0000 mL | RESPIRATORY_TRACT | Status: DC | PRN

## 2015-08-04 MED ORDER — ENOXAPARIN SODIUM 40 MG/0.4ML ~~LOC~~ SOLN
40.0000 mg | SUBCUTANEOUS | Status: DC
  Administered 2015-08-04 – 2015-08-05 (×2): 40 mg via SUBCUTANEOUS
  Filled 2015-08-04 (×3): qty 0.4

## 2015-08-04 NOTE — Progress Notes (Signed)
Patient keeps refusing neb treatments changed order to Q4 PRN

## 2015-08-04 NOTE — Progress Notes (Signed)
Called vassie to give report. Pt transferring to 6N03 with all personal belongings. Family at bedside

## 2015-08-04 NOTE — Progress Notes (Signed)
Report attempt to 6N03.
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
## 2015-08-05 MED ORDER — MORPHINE SULFATE (PF) 2 MG/ML IV SOLN: 2.0000 mg | INTRAVENOUS | Status: DC | PRN

## 2015-08-05 MED ORDER — TRAMADOL HCL 50 MG PO TABS: 50.0000 mg | ORAL_TABLET | Freq: Four times a day (QID) | ORAL | Status: DC | PRN
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
## 2015-08-06 ENCOUNTER — Inpatient Hospital Stay (HOSPITAL_COMMUNITY): Payer: Self-pay

## 2015-08-06 LAB — PATHOLOGIST SMEAR REVIEW

## 2015-08-06 LAB — CBC
HEMATOCRIT: 31.7 % — AB (ref 39.0–52.0)
Hemoglobin: 10.7 g/dL — ABNORMAL LOW (ref 13.0–17.0)
MCH: 29.6 pg (ref 26.0–34.0)
MCHC: 33.8 g/dL (ref 30.0–36.0)
MCV: 87.8 fL (ref 78.0–100.0)
PLATELETS: 1022 10*3/uL — AB (ref 150–400)
RBC: 3.61 MIL/uL — AB (ref 4.22–5.81)
RDW: 13.5 % (ref 11.5–15.5)
WBC: 10.6 10*3/uL — AB (ref 4.0–10.5)

## 2015-08-06 MED ORDER — BOOST / RESOURCE BREEZE PO LIQD
1.0000 | Freq: Three times a day (TID) | ORAL | Status: DC
  Administered 2015-08-06: 1 via ORAL
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
## 2015-08-06 NOTE — Discharge Instructions (Signed)
Wash wounds daily in shower with soap and water. Do not soak. Apply antibiotic ointment (e.g. Neosporin) twice daily and as needed to keep moist. Cover with dry dressing.  

## 2015-08-06 NOTE — Progress Notes (Signed)
Sutures removed on face and ear

## 2015-08-06 NOTE — Progress Notes (Signed)
Frederick BellingArturo XXXMorales to be D/C'd  per MD order. Discussed with the patient and all questions fully answered.  VSS, Skin clean, dry and intact without evidence of skin break down, no evidence of skin tears noted.  IV catheter discontinued intact. Site without signs and symptoms of complications. Dressing and pressure applied.  An After Visit Summary was printed and given to the patient. Patient received prescription.  D/c education completed with patient/family including follow up instructions, medication list, d/c activities limitations if indicated, with other d/c instructions as indicated by MD - patient able to verbalize understanding, all questions fully answered.   Patient instructed to return to ED, call 911, or call MD for any changes in condition.   Patient to be escorted via WC, and D/C home via private auto.

## 2015-08-06 NOTE — Progress Notes (Signed)
MBSS complete. Full report located under chart review in imaging section. Marrissa Dai, MA CCC-SLP [phone]  

## 2015-08-06 NOTE — Care Management Note (Signed)
Case Management Note  Patient Details  Name: Frederick [name] MRN: [id number] Date of Birth: 11/18/1985  Subjective/Objective:                    Action/Plan:  Patient for outpatient speech , called Raynelle FanningJulie at [facility] [phone] explained patient has no insurance . Patient can be seen and treated . They will assist with financial   aide paper work. If patient does not qualify for financial aide , they will set him up on payment plan . Patient would have to pay $35.00 up front . Explained same to patient and visitors . Patient states he is agreeable and willing to provide $35 upfront.   Referral form faxed to Raynelle FanningJulie at [facility] . Confirmed patient's phone number . They will call him with appointment . Patient has  [facility] contact information. Expected Discharge Date:                  Expected Discharge Plan:  Home/Self Care  In-House Referral:     Discharge planning Services  CM Consult  Post Acute Care Choice:    Choice offered to:  Patient  DME Arranged:    DME Agency:     HH Arranged:    HH Agency:     Status of Service:  Completed, signed off  Medicare Important Message Given:    Date Medicare IM Given:    Medicare IM give by:    Date Additional Medicare IM Given:    Additional Medicare Important Message give by:     If discussed at Long Length of Stay Meetings, dates discussed:    Additional Comments:  Frederick [name], Frederick Guerrette Marie, RN 08/06/2015, 2:47 PM

## 2015-08-06 NOTE — Progress Notes (Signed)
Brief Nutrition Follow-Up Note  Chart reviewed. Pt underwent BSE on 08/03/15; pt has been advanced to clear liquid diet. Tolerating well. Noted 75% meal completion. RD will add Boost Breeze po TID, each supplement provides 250 kcal and 9 grams of protein to optimize intake, due to limits of current diet.   RD will continue to follow.   Matasha Smigelski A. Mayford KnifeWilliams, RD, LDN, CDE Pager: [phone] After hours Pager: [phone]

## 2015-08-14 ENCOUNTER — Telehealth (HOSPITAL_COMMUNITY): Payer: Self-pay

## 2015-08-14 NOTE — Telephone Encounter (Signed)
LM to call back.

## 2015-08-15 ENCOUNTER — Encounter: Payer: Self-pay | Admitting: Orthopedic Surgery

## 2015-08-16 ENCOUNTER — Ambulatory Visit (INDEPENDENT_AMBULATORY_CARE_PROVIDER_SITE_OTHER): Payer: Self-pay | Admitting: Otolaryngology

## 2015-08-16 DIAGNOSIS — H93299 Other abnormal auditory perceptions, unspecified ear: Secondary | ICD-10-CM

## 2015-08-16 NOTE — Telephone Encounter (Signed)
Wrote letter excusing him for work.

## 2015-08-21 ENCOUNTER — Ambulatory Visit: Payer: No Typology Code available for payment source

## 2015-08-24 ENCOUNTER — Encounter: Payer: Self-pay | Admitting: Physician Assistant

## 2015-08-24 ENCOUNTER — Ambulatory Visit: Payer: Self-pay | Attending: Physician Assistant | Admitting: Physician Assistant

## 2015-08-24 VITALS — BP 102/71 | HR 90 | Temp 98.0°F | Resp 16 | Ht 71.0 in | Wt 167.0 lb

## 2015-08-24 DIAGNOSIS — L0390 Cellulitis, unspecified: Secondary | ICD-10-CM | POA: Insufficient documentation

## 2015-08-24 DIAGNOSIS — Z9889 Other specified postprocedural states: Secondary | ICD-10-CM | POA: Insufficient documentation

## 2015-08-24 DIAGNOSIS — L03211 Cellulitis of face: Secondary | ICD-10-CM

## 2015-08-24 DIAGNOSIS — R1310 Dysphagia, unspecified: Secondary | ICD-10-CM | POA: Insufficient documentation

## 2015-08-24 DIAGNOSIS — T148 Other injury of unspecified body region: Secondary | ICD-10-CM

## 2015-08-24 DIAGNOSIS — W3400XA Accidental discharge from unspecified firearms or gun, initial encounter: Secondary | ICD-10-CM

## 2015-08-24 MED ORDER — CEPHALEXIN 500 MG PO CAPS: 500.0000 mg | ORAL_CAPSULE | Freq: Three times a day (TID) | ORAL | Status: DC
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
## 2015-08-24 NOTE — Patient Instructions (Signed)
Melatonin over the counter may help you sleep better. 3 mg.   Keep putting the antibiotic ointment on the right side of your face.  Pick up the Antibiotic and start taking right away.

## 2015-08-28 ENCOUNTER — Telehealth (HOSPITAL_COMMUNITY): Payer: Self-pay

## 2015-08-28 NOTE — Telephone Encounter (Signed)
GF called to get FMLA forms for mom as Dr. Teoh wouldn't do it. I told heSuszanne Connersr that we could do it for the time he was hospitalized but post-discharge would have to come from Dr. Suszanne Connerseoh. She was going to send it to [facility] for completion. I also gave her [facility]'s name as it's arduous to travel to see Dr. Suszanne Connerseoh. I told her that they may not be willing to see him given they didn't operate.

## 2015-08-30 ENCOUNTER — Ambulatory Visit (INDEPENDENT_AMBULATORY_CARE_PROVIDER_SITE_OTHER): Payer: Self-pay | Admitting: Otolaryngology

## 2015-08-30 DIAGNOSIS — S1120XS Unspecified open wound of pharynx and cervical esophagus, sequela: Secondary | ICD-10-CM

## 2015-09-04 ENCOUNTER — Ambulatory Visit: Payer: Self-pay | Attending: Orthopedic Surgery

## 2015-09-04 DIAGNOSIS — R1313 Dysphagia, pharyngeal phase: Secondary | ICD-10-CM | POA: Insufficient documentation
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
## 2015-09-04 NOTE — Patient Instructions (Signed)
SWALLOWING EXERCISES Do these 6 of the 7 days per week It may take up to 6 weeks before you see progress with your swallowing skills  1. Effortful Swallows - Push your tongue up the the roof of your mouth for 3 seconds, then squeeze hard with the muscles in your neck while you swallow your  saliva or a sip of water - Repeat 20 times, 2-3 times a day, and use whenever you eat or drink  2. Masako Swallow - swallow with your tongue sticking out - Stick tongue out past your teeth and gently bite tongue with your teeth - Swallow, while holding your tongue with your teeth - Repeat 20 times, 2-3 times a day *use a wet spoon if your mouth gets dry*  3. Pitch Raise - Repeat "he", once per second in as high of a pitch as you can - Repeat 20 times, 2-3 times a day  4. Shaker Exercise - head lift - Lie flat on your back in your bed or on a couch without pillows - Raise your head and look at your feet - KEEP YOUR SHOULDERS DOWN - HOLD FOR 60 SECONDS, then lower your head back down - Repeat 3 times, 2 times a day  5. Mendelsohn Maneuver - "half swallow" exercise - Start to swallow, and keep your Adam's apple up by squeezing hard with the muscles of the throat - Hold the squeeze for 5-7 seconds and then relax - Repeat 20 times, 2-3 times a day *use a wet spoon if your mouth gets dry*  6. Tongue Stretch/Teeth Clean - Move your tongue around the pocket between your gums and teeth, clockwise and then counter-clockwise - Repeat on the back side, if desired, clockwise and then counter-clockwise - Repeat 10 times, 2-3 times a day  7. Breath Hold - Say "HUH!" loudly, then hold your breath for 3 seconds at your voice box - Repeat 20 times, 2-3 times a day  8. Chin pushback - Open your mouth  - Place your fist UNDER your chin near your neck, and push back with your fist for 5 seconds - Repeat 10 times, 2-3 times a day  *Pt was provided paper copy of these exercises on 09-04-15

## 2015-09-12 ENCOUNTER — Telehealth (HOSPITAL_COMMUNITY): Payer: Self-pay

## 2015-09-13 ENCOUNTER — Telehealth (HOSPITAL_COMMUNITY): Payer: Self-pay

## 2015-09-13 ENCOUNTER — Ambulatory Visit: Payer: Self-pay

## 2015-09-13 DIAGNOSIS — R1313 Dysphagia, pharyngeal phase: Secondary | ICD-10-CM

## 2015-09-13 NOTE — Telephone Encounter (Signed)
Called the office.  Will fill out FMLA fpaperwork 4/4-4/17 admit to discharge.  Dr. Suszanne Connerseoh to fill out further paperwork.
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
## 2015-09-13 NOTE — Telephone Encounter (Signed)
Left message for the patient.  Working on [employer] paperwork

## 2015-09-13 NOTE — Telephone Encounter (Signed)
See latter note.

## 2015-09-14 ENCOUNTER — Ambulatory Visit: Payer: Self-pay

## 2015-09-14 DIAGNOSIS — R1313 Dysphagia, pharyngeal phase: Secondary | ICD-10-CM
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
## 2015-09-18 ENCOUNTER — Encounter: Payer: Self-pay | Admitting: Internal Medicine

## 2015-09-18 ENCOUNTER — Ambulatory Visit: Payer: Self-pay | Attending: Internal Medicine | Admitting: Internal Medicine

## 2015-09-18 ENCOUNTER — Telehealth: Payer: Self-pay | Admitting: Internal Medicine

## 2015-09-18 ENCOUNTER — Ambulatory Visit: Payer: Self-pay

## 2015-09-18 VITALS — BP 119/84 | HR 97 | Temp 97.9°F | Wt 172.6 lb

## 2015-09-18 DIAGNOSIS — R1310 Dysphagia, unspecified: Secondary | ICD-10-CM

## 2015-09-18 DIAGNOSIS — T148 Other injury of unspecified body region: Secondary | ICD-10-CM

## 2015-09-18 DIAGNOSIS — W3400XA Accidental discharge from unspecified firearms or gun, initial encounter: Secondary | ICD-10-CM

## 2015-09-18 DIAGNOSIS — L03211 Cellulitis of face: Secondary | ICD-10-CM

## 2015-09-18 DIAGNOSIS — R1313 Dysphagia, pharyngeal phase: Secondary | ICD-10-CM

## 2015-09-18 NOTE — Telephone Encounter (Signed)
Called pt, he was not home.  Talked to patients' family.  Asked them to have pt call back when he gets home.  Pt needs to come back for TDAP when he gets a chance, does not look like he had it while in hospital recently.  TDAP future order placed.
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
## 2015-09-26 ENCOUNTER — Ambulatory Visit: Payer: Self-pay | Attending: Orthopedic Surgery

## 2015-09-26 DIAGNOSIS — R41841 Cognitive communication deficit: Secondary | ICD-10-CM | POA: Insufficient documentation

## 2015-09-26 DIAGNOSIS — R1313 Dysphagia, pharyngeal phase: Secondary | ICD-10-CM | POA: Insufficient documentation

## 2015-09-28 ENCOUNTER — Ambulatory Visit: Payer: Self-pay

## 2015-09-28 DIAGNOSIS — R1313 Dysphagia, pharyngeal phase: Secondary | ICD-10-CM

## 2015-09-28 DIAGNOSIS — R41841 Cognitive communication deficit: Secondary | ICD-10-CM

## 2015-09-28 NOTE — Patient Instructions (Signed)
Just do: Hard Swallow Masako (tongue out) Mendelsohn (Adam's apple) Fist/chin Shaker
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
## 2015-10-05 ENCOUNTER — Ambulatory Visit: Payer: Self-pay

## 2015-10-11 ENCOUNTER — Ambulatory Visit: Payer: Self-pay | Admitting: Speech Pathology

## 2015-10-17 ENCOUNTER — Ambulatory Visit: Payer: Self-pay

## 2016-01-03 ENCOUNTER — Encounter: Payer: Self-pay | Admitting: Internal Medicine

## 2016-01-03 ENCOUNTER — Ambulatory Visit: Payer: Self-pay | Attending: Internal Medicine | Admitting: Internal Medicine

## 2016-01-03 VITALS — BP 110/72 | HR 86 | Temp 98.4°F | Ht 69.75 in | Wt 179.4 lb

## 2016-01-03 DIAGNOSIS — S161XXA Strain of muscle, fascia and tendon at neck level, initial encounter: Secondary | ICD-10-CM | POA: Insufficient documentation

## 2016-01-03 DIAGNOSIS — F515 Nightmare disorder: Secondary | ICD-10-CM

## 2016-01-03 DIAGNOSIS — R413 Other amnesia: Secondary | ICD-10-CM | POA: Insufficient documentation

## 2016-01-03 DIAGNOSIS — G44209 Tension-type headache, unspecified, not intractable: Secondary | ICD-10-CM | POA: Insufficient documentation

## 2016-01-03 DIAGNOSIS — S161XXS Strain of muscle, fascia and tendon at neck level, sequela: Secondary | ICD-10-CM

## 2016-01-03 DIAGNOSIS — X58XXXA Exposure to other specified factors, initial encounter: Secondary | ICD-10-CM | POA: Insufficient documentation

## 2016-01-03 DIAGNOSIS — Z79899 Other long term (current) drug therapy: Secondary | ICD-10-CM | POA: Insufficient documentation

## 2016-01-03 DIAGNOSIS — F329 Major depressive disorder, single episode, unspecified: Secondary | ICD-10-CM | POA: Insufficient documentation

## 2016-01-03 DIAGNOSIS — R0602 Shortness of breath: Secondary | ICD-10-CM | POA: Insufficient documentation

## 2016-01-03 MED ORDER — AMITRIPTYLINE HCL 25 MG PO TABS: 25.0000 mg | ORAL_TABLET | Freq: Every day | ORAL | 2 refills | Status: DC

## 2016-01-03 MED ORDER — CYCLOBENZAPRINE HCL 5 MG PO TABS: 5.0000 mg | ORAL_TABLET | Freq: Three times a day (TID) | ORAL | 0 refills | Status: DC | PRN

## 2016-01-03 NOTE — Patient Instructions (Signed)
Cervical Sprain A cervical sprain is when the tissues (ligaments) that hold the neck bones in place stretch or tear. HOME CARE   Put ice on the injured area.  Put ice in a plastic bag.  Place a towel between your skin and the bag.  Leave the ice on for 15-20 minutes, 3-4 times a day.  You may have been given a collar to wear. This collar keeps your neck from moving while you heal.  Do not take the collar off unless told by your doctor.  If you have long hair, keep it outside of the collar.  Ask your doctor before changing the position of your collar. You may need to change its position over time to make it more comfortable.  If you are allowed to take off the collar for cleaning or bathing, follow your doctor's instructions on how to do it safely.  Keep your collar clean by wiping it with mild soap and water. Dry it completely. If the collar has removable pads, remove them every 1-2 days to hand wash them with soap and water. Allow them to air dry. They should be dry before you wear them in the collar.  Do not drive while wearing the collar.  Only take medicine as told by your doctor.  Keep all doctor visits as told.  Keep all physical therapy visits as told.  Adjust your work station so that you have good posture while you work.  Avoid positions and activities that make your problems worse.  Warm up and stretch before being active. GET HELP IF:  Your pain is not controlled with medicine.  You cannot take less pain medicine over time as planned.  Your activity level does not improve as expected. GET HELP RIGHT AWAY IF:   You are bleeding.  Your stomach is upset.  You have an allergic reaction to your medicine.  You develop new problems that you cannot explain.  You lose feeling (become numb) or you cannot move any part of your body (paralysis).  You have tingling or weakness in any part of your body.  Your symptoms get worse. Symptoms include:  Pain,  soreness, stiffness, puffiness (swelling), or a burning feeling in your neck.  Pain when your neck is touched.  Shoulder or upper back pain.  Limited ability to move your neck.  Headache.  Dizziness.  Your hands or arms feel week, lose feeling, or tingle.  Muscle spasms.  Difficulty swallowing or chewing. MAKE SURE YOU:   Understand these instructions.  Will watch your condition.  Will get help right away if you are not doing well or get worse.   This information is not intended to replace advice given to you by your health care provider. Make sure you discuss any questions you have with your health care provider.   Document Released: 09/24/2007 Document Revised: 12/08/2012 Document Reviewed: 10/13/2012 Elsevier Interactive Patient Education 2016 Elsevier Inc.   -  Tension Headache A tension headache is pain, pressure, or aching that is felt over the front and sides of your head. These headaches can last from 30 minutes to several days. HOME CARE Managing Pain  Take over-the-counter and prescription medicines only as told by your doctor.  Lie down in a dark, quiet room when you have a headache.  If directed, apply ice to your head and neck area:  Put ice in a plastic bag.  Place a towel between your skin and the bag.  Leave the ice on for 20 minutes,  day. °· Use a heating pad or a hot shower to apply heat to your head and neck area as told by your doctor. °Eating and Drinking °· Eat meals on a regular schedule. °· Do not drink a lot of alcohol. °· Do not use a lot of caffeine, or stop using caffeine. °General Instructions °· Keep all follow-up visits as told by your doctor. This is important. °· Keep a journal to find out if certain things bring on headaches. For example, write down: °¨ What you eat and drink. °¨ How much sleep you get. °¨ Any change to your diet or medicines. °· Try getting a massage, or doing other things that help you to relax. °· Lessen  stress. °· Sit up straight. Do not tighten (tense) your muscles. °· Do not use tobacco products. This includes cigarettes, chewing tobacco, or e-cigarettes. If you need help quitting, ask your doctor. °· Exercise regularly as told by your doctor. °· Get enough sleep. This may mean 7-9 hours of sleep. °GET HELP IF: °· Your symptoms are not helped by medicine. °· You have a headache that feels different from your usual headache. °· You feel sick to your stomach (nauseous) or you throw up (vomit). °· You have a fever. °GET HELP RIGHT AWAY IF: °· Your headache becomes very bad. °· You keep throwing up. °· You have a stiff neck. °· You have trouble seeing. °· You have trouble speaking. °· You have pain in your eye or ear. °· Your muscles are weak or you lose muscle control. °· You lose your balance or you have trouble walking. °· You feel like you will pass out (faint) or you pass out. °· You have confusion. °  °This information is not intended to replace advice given to you by your health care provider. Make sure you discuss any questions you have with your health care provider. °  °Document Released: 07/02/2009 Document Revised: 12/27/2014 Document Reviewed: 07/31/2014 °Elsevier Interactive Patient Education ©2016 Elsevier Inc. ° °

## 2016-01-03 NOTE — Progress Notes (Signed)
Needs letter that supports his inability to work at the present time
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
## 2016-01-07 ENCOUNTER — Telehealth: Payer: Self-pay | Admitting: Internal Medicine

## 2016-01-07 NOTE — Telephone Encounter (Signed)
Pt. Called stating that his PCP wrote a letter for him and he states That the court will not accept it b/c it does not say anything about His employment status. Pt. States he is still having neck and back Pain and that is why he is unable to work. Pt. Would like a letter stating He is unable to work at this time b/c of his symptoms. Please f/u.

## 2016-01-08 NOTE — Telephone Encounter (Signed)
Will forward to pcp

## 2016-01-08 NOTE — Telephone Encounter (Signed)
Letter done. Can call pt to pick up. thx
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
## 2016-06-03 IMAGING — CR DG CHEST 1V PORT
1 series · 1 of 1 positions shown · non-contrast
Comparison: Multiple priors.  08/01/2013.

CLINICAL DATA: Elevated white count.  Follow-up aspiration.

EXAM:
PORTABLE CHEST 1 VIEW

[portable]
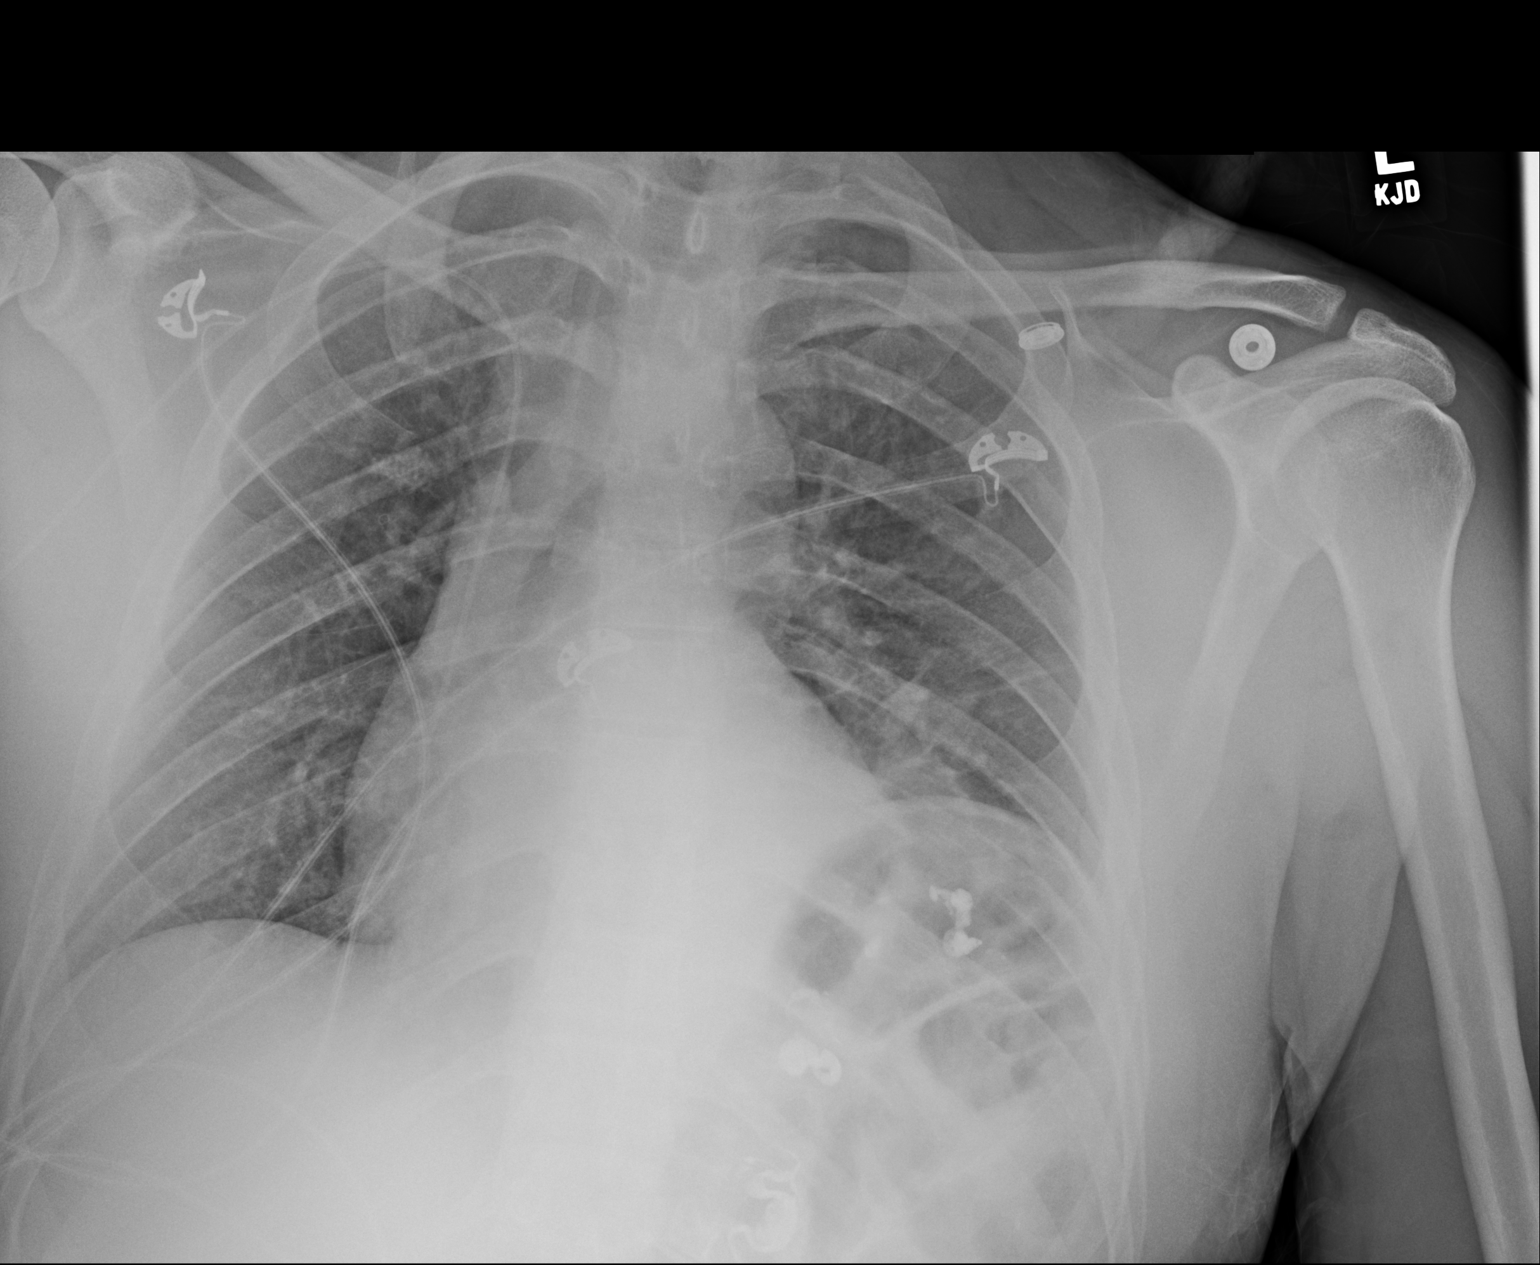

[1 of 1 positions shown; findings below may reference images not displayed]

FINDINGS: Elevated LEFT hemidiaphragm and hazy retrocardiac opacity consistent
with aspiration pneumonitis or pneumonia appears stable. No
pneumothorax. No significant effusion. PICC line unchanged.
IMPRESSION: Stable chest.

## 2016-12-19 ENCOUNTER — Other Ambulatory Visit: Payer: Self-pay | Admitting: Family Medicine

## 2016-12-19 ENCOUNTER — Ambulatory Visit: Payer: Medicaid Other | Attending: Family Medicine | Admitting: Family Medicine

## 2016-12-19 ENCOUNTER — Encounter: Payer: Self-pay | Admitting: Family Medicine

## 2016-12-19 VITALS — BP 113/73 | HR 87 | Temp 98.1°F | Resp 18 | Ht 70.0 in | Wt 187.8 lb

## 2016-12-19 DIAGNOSIS — Z8781 Personal history of (healed) traumatic fracture: Secondary | ICD-10-CM | POA: Diagnosis not present

## 2016-12-19 DIAGNOSIS — R42 Dizziness and giddiness: Secondary | ICD-10-CM | POA: Insufficient documentation

## 2016-12-19 DIAGNOSIS — J9600 Acute respiratory failure, unspecified whether with hypoxia or hypercapnia: Secondary | ICD-10-CM | POA: Diagnosis not present

## 2016-12-19 DIAGNOSIS — IMO0002 Reserved for concepts with insufficient information to code with codable children: Secondary | ICD-10-CM

## 2016-12-19 DIAGNOSIS — R0600 Dyspnea, unspecified: Secondary | ICD-10-CM

## 2016-12-19 DIAGNOSIS — R269 Unspecified abnormalities of gait and mobility: Secondary | ICD-10-CM | POA: Insufficient documentation

## 2016-12-19 DIAGNOSIS — R413 Other amnesia: Secondary | ICD-10-CM | POA: Insufficient documentation

## 2016-12-19 DIAGNOSIS — M545 Low back pain: Secondary | ICD-10-CM | POA: Insufficient documentation

## 2016-12-19 DIAGNOSIS — G43709 Chronic migraine without aura, not intractable, without status migrainosus: Secondary | ICD-10-CM

## 2016-12-19 DIAGNOSIS — M542 Cervicalgia: Secondary | ICD-10-CM

## 2016-12-19 DIAGNOSIS — M549 Dorsalgia, unspecified: Secondary | ICD-10-CM | POA: Diagnosis not present

## 2016-12-19 DIAGNOSIS — G8929 Other chronic pain: Secondary | ICD-10-CM | POA: Diagnosis not present

## 2016-12-19 DIAGNOSIS — G43909 Migraine, unspecified, not intractable, without status migrainosus: Secondary | ICD-10-CM | POA: Insufficient documentation

## 2016-12-19 DIAGNOSIS — R339 Retention of urine, unspecified: Secondary | ICD-10-CM | POA: Diagnosis not present

## 2016-12-19 DIAGNOSIS — R110 Nausea: Secondary | ICD-10-CM | POA: Insufficient documentation

## 2016-12-19 DIAGNOSIS — Z79899 Other long term (current) drug therapy: Secondary | ICD-10-CM | POA: Diagnosis not present

## 2016-12-19 DIAGNOSIS — G4700 Insomnia, unspecified: Secondary | ICD-10-CM | POA: Insufficient documentation

## 2016-12-19 DIAGNOSIS — R6889 Other general symptoms and signs: Secondary | ICD-10-CM | POA: Diagnosis not present

## 2016-12-19 DIAGNOSIS — F4310 Post-traumatic stress disorder, unspecified: Secondary | ICD-10-CM | POA: Diagnosis not present

## 2016-12-19 MED ORDER — NAPROXEN 500 MG PO TABS: ORAL_TABLET | ORAL | 0 refills | Status: DC

## 2016-12-19 MED ORDER — TAMSULOSIN HCL 0.4 MG PO CAPS: 0.4000 mg | ORAL_CAPSULE | Freq: Every day | ORAL | 0 refills | Status: DC

## 2016-12-19 MED ORDER — METHOCARBAMOL 500 MG PO TABS: 500.0000 mg | ORAL_TABLET | Freq: Three times a day (TID) | ORAL | 0 refills | Status: AC | PRN

## 2016-12-19 MED ORDER — ALBUTEROL SULFATE HFA 108 (90 BASE) MCG/ACT IN AERS: 2.0000 | INHALATION_SPRAY | Freq: Four times a day (QID) | RESPIRATORY_TRACT | 0 refills | Status: AC | PRN
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
## 2016-12-19 NOTE — Progress Notes (Signed)
Patient is here for neck pain & back pain   Patient stated that [facility] told him to stop amitriptyline and start paroxetine   Patient also stated that amitriptyline makes him have palpitations   Patient stated that his head medication is not helping him

## 2016-12-19 NOTE — Patient Instructions (Signed)

## 2016-12-20 LAB — CBC WITH DIFFERENTIAL/PLATELET
BASOS ABS: 0 10*3/uL (ref 0.0–0.2)
BASOS: 0 %
EOS (ABSOLUTE): 0.1 10*3/uL (ref 0.0–0.4)
EOS: 2 %
Hematocrit: 44.4 % (ref 37.5–51.0)
Hemoglobin: 14.8 g/dL (ref 13.0–17.7)
IMMATURE GRANS (ABS): 0 10*3/uL (ref 0.0–0.1)
Immature Granulocytes: 0 %
LYMPHS ABS: 2.1 10*3/uL (ref 0.7–3.1)
Lymphs: 37 %
MCH: 30.3 pg (ref 26.6–33.0)
MCHC: 33.3 g/dL (ref 31.5–35.7)
MCV: 91 fL (ref 79–97)
Monocytes Absolute: 0.4 10*3/uL (ref 0.1–0.9)
Monocytes: 7 %
Neutrophils Absolute: 3 10*3/uL (ref 1.4–7.0)
Neutrophils: 54 %
PLATELETS: 236 10*3/uL (ref 150–379)
RBC: 4.88 x10E6/uL (ref 4.14–5.80)
RDW: 13.5 % (ref 12.3–15.4)
WBC: 5.6 10*3/uL (ref 3.4–10.8)

## 2016-12-20 LAB — BASIC METABOLIC PANEL
BUN / CREAT RATIO: 9 (ref 9–20)
BUN: 9 mg/dL (ref 6–20)
CO2: 23 mmol/L (ref 20–29)
CREATININE: 0.97 mg/dL (ref 0.76–1.27)
Calcium: 9.3 mg/dL (ref 8.7–10.2)
Chloride: 103 mmol/L (ref 96–106)
GFR calc Af Amer: 121 mL/min/{1.73_m2} (ref >59)
GFR calc non Af Amer: 105 mL/min/{1.73_m2} (ref >59)
GLUCOSE: 80 mg/dL (ref 65–99)
POTASSIUM: 4.4 mmol/L (ref 3.5–5.2)
SODIUM: 139 mmol/L (ref 134–144)

## 2016-12-23 ENCOUNTER — Telehealth: Payer: Self-pay

## 2016-12-23 NOTE — Telephone Encounter (Signed)
-----   Message from Lizbeth BarkMandesia R Hairston, [place]NP sent at 12/23/2016  8:55 AM EDT ----- Kidney function normal Labs that evaluated your blood cells, fluid and electrolyte balance are normal. No signs of anemia, acute infection, or inflammation present.

## 2016-12-23 NOTE — Telephone Encounter (Signed)
CMA call regarding lab results   Patient verify DOB  Patient was aware and understood  

## 2017-01-23 NOTE — Therapy (Signed)
[facility] [address] [place], [place], [address] Phone: [phone]   Fax:  [phone]  Patient Details  Name: Frederick [name] MRN: [id number] Date of Birth: 07-Aug-1985 Referring Provider:  No ref. provider found  Encounter Date: 01/23/2017 SPEECH THERAPY DISCHARGE SUMMARY  Visits from Start of Care: 5  Current functional level related to goals / functional outcomes: Pt did not return to ST following conclusion of swallowing therapy focus. SLP wanted to change focus of ST to cognition, however pt did not return to ST.    Remaining deficits: Assumed cognitive deficits and possibly swallowing deficits remain.    Education / Equipment: HEP, swallow precautions.  Plan: Patient agrees to discharge.  Patient goals were partially met. Patient is being discharged due to not returning since the last visit.  ?????       [facility] 01/23/2017, 5:07 PM  [facility] [address] [place], [place], [address] Phone: [phone]   Fax:  [phone]

## 2017-02-13 ENCOUNTER — Ambulatory Visit: Payer: Self-pay | Admitting: Family Medicine

## 2017-02-17 ENCOUNTER — Encounter (INDEPENDENT_AMBULATORY_CARE_PROVIDER_SITE_OTHER): Payer: Self-pay

## 2017-02-17 ENCOUNTER — Encounter: Payer: Self-pay | Admitting: Neurology

## 2017-02-17 ENCOUNTER — Ambulatory Visit (INDEPENDENT_AMBULATORY_CARE_PROVIDER_SITE_OTHER): Payer: Medicaid Other | Admitting: Neurology

## 2017-02-17 VITALS — BP 135/92 | HR 87 | Wt 192.6 lb

## 2017-02-17 DIAGNOSIS — S062X9S Diffuse traumatic brain injury with loss of consciousness of unspecified duration, sequela: Secondary | ICD-10-CM | POA: Diagnosis not present

## 2017-02-17 DIAGNOSIS — R202 Paresthesia of skin: Secondary | ICD-10-CM | POA: Diagnosis not present

## 2017-02-17 DIAGNOSIS — R200 Anesthesia of skin: Secondary | ICD-10-CM

## 2017-02-17 DIAGNOSIS — R5382 Chronic fatigue, unspecified: Secondary | ICD-10-CM

## 2017-02-17 DIAGNOSIS — R413 Other amnesia: Secondary | ICD-10-CM

## 2017-02-17 DIAGNOSIS — G4719 Other hypersomnia: Secondary | ICD-10-CM | POA: Diagnosis not present

## 2017-02-17 DIAGNOSIS — R51 Headache: Secondary | ICD-10-CM | POA: Diagnosis not present

## 2017-02-17 DIAGNOSIS — G8929 Other chronic pain: Secondary | ICD-10-CM

## 2017-02-17 DIAGNOSIS — E538 Deficiency of other specified B group vitamins: Secondary | ICD-10-CM

## 2017-02-17 DIAGNOSIS — Z72820 Sleep deprivation: Secondary | ICD-10-CM | POA: Diagnosis not present

## 2017-02-17 DIAGNOSIS — R519 Headache, unspecified: Secondary | ICD-10-CM

## 2017-02-17 MED ORDER — AMITRIPTYLINE HCL 50 MG PO TABS: 50.0000 mg | ORAL_TABLET | Freq: Every day | ORAL | 11 refills | Status: DC

## 2017-02-17 NOTE — Patient Instructions (Signed)
Orders Placed This Encounter  Procedures  . CT HEAD W & WO CONTRAST  . TSH  . BUN  . Creatinine, Serum  . B12 and Folate Panel  . Methylmalonic acid, serum  . Ambulatory referral to Sleep Studies  . NCV with EMG(electromyography)    Traumatic Brain Injury Traumatic brain injury (TBI) is an injury to your brain from a blow to your head (closed injury) or an object penetrating your skull and entering your brain (open injury). The severity of TBI varies significantly from one person to the next. Some TBIs cause you to pass out (lose consciousness) immediately and for a long period of time. Other TBIs do not cause any loss of consciousness. Symptoms of any type of TBI can be long lasting (chronic). TBI can interfere with memory and speech. TBI can also cause chronic symptoms like headache or dizziness. What are the causes? TBI is caused by a closed or open injury. What increases the risk? You may be at higher risk for TBI if you:  Are 75 or older.  Are a man.  Are in a car accident.  Play a contact sport, especially football, hockey, or soccer.  Do not wear protective gear while playing sports.  Are in the [employer].  Are a victim of violence.  Abuse drugs or alcohol.  Have had a previous TBI.  What are the signs or symptoms? Signs and symptoms of TBI may occur right away or not until days, weeks, or months after the injury. They may last for days, weeks, months, or years. Symptoms may include:  Loss of consciousness.  Headache.  Confusion.  Fatigue.  Changes in sleep.  Dizziness.  Mood or personality changes.  Memory problems.  Nausea or vomiting or both.  Seizures.  Clumsiness.  Slurred speech.  Depression and anxiety.  Anger.  Inability to control emotions or actions (impulse control).  Loss of or dulling of your senses, such as hearing, vision, and touch. This can include: ? Blurred vision. ? Ringing in your ears.  How is this diagnosed? TBI  may be diagnosed by a medical history and physical exam. Your health care provider will also do a neurologic exam to check your:  Reflexes.  Sensations.  Alertness.  Memory.  Vision.  Hearing.  Coordination.  Your health care provider will also do tests to diagnose the extent of your TBI, such as a CT scan of your brain and skull. One way to determine the severity of your TBI is with a scoring system called the Glasgow Coma Scale (GCS). It measures eye opening, motor response, and verbal response. The higher the score, the milder the TBI. Your TBI may be described as mild, moderate, or severe:  Mild TBI (concussion). ? Symptoms of mild TBI usually go away on their own. This can take weeks or months, depending on the type of concussion. ? Your GCS will be 13-15. ? Your brain CT scan will be normal. ? You may or may not have a short hospital stay.  Moderate TBI. ? Your GCS will be 9-12. ? Your brain CT scan will be abnormal. ? You will likely need a short hospital stay.  Severe TBI. ? Your GCS will be 3-8. ? Your brain CT scan will be abnormal. ? You may need a long stay in the hospital.  How is this treated? Emergency treatment of TBI may involve measures to maintain a clear airway and stable blood pressure. Brain surgery may be needed to:  Remove a blood  clot.  Repair bleeding.  Remove an object that has penetrated the brain, such as a skull fragment or a bullet.  Other treatments depend on your chronic signs and symptoms. These treatments include the following types of therapy:  Physical.  Occupational.  Speech and language.  Mental health.  Social support.  Follow these instructions at home:  Carefully follow all your health care provider's instructions.  Work closely with all your therapists, if necessary.  Take medicines only as directed by your health care provider. Do not take aspirin or other anti-inflammatory medicines such as ibuprofen or  naproxen unless approved by your health care provider.  Do not abuse illegal drugs.  Limit alcohol intake to no more than 1 drink per day for nonpregnant women and 2 drinks per day for men. One drink equals 12 ounces of beer, 5 ounces of wine, or 1 ounces of hard liquor.  Avoid any situation where there is potential for another head injury, such as football, hockey, soccer, basketball, martial arts, downhill snow sports, and horseback riding. Do not do these activities until your health care provider approves.  Rest. Rest helps the brain to heal. Make sure you: ? Get plenty of sleep at night. Avoid staying up late at night. ? Keep the same bedtime hours on weekends and weekdays. ? Rest during the day. Take daytime naps or rest breaks when you feel tired.  Avoid excessive visual stimulation while recovering from a TBI. This includes work on the computer, watching TV, and reading.  Try to avoid activities that cause physical or mental stress. Stay home from work or school as directed by your health care provider.  Make lists to plan your day and help your memory.  Do not drive, ride a bicycle, or operate heavy machinery until your health care provider approves.  Seek support from friends and family.  Keep all follow-up visits as directed by your health care provider. This is important.  Watch your symptoms and tell others to do the same. Complications sometimes occur after a TBI. How is this prevented?  Wear a helmet while biking, skiing, skateboarding, skating, or doing similar activities. Wear your seatbelt while driving.  Do not abuse alcohol or drugs.  Do not drink and drive.  Prevent falls at home by: ? Removing clutter and tripping hazards, including loose rugs, from floors and stairways. ? Using grab bars in bathrooms and handrails by stairs. ? Placing nonslip mats on floors and in bathtubs. ? Improving lighting in dim areas. Contact a health care provider if: Seek  medical care if you have any of the following symptoms for more than two weeks after your injury:  Chronic headaches.  Dizziness or balance problems.  Nausea.  Vision problems.  Increased sensitivity to noise or light.  Depression or mood swings.  Anxiety or irritability.  Memory problems.  Difficulty concentrating or paying attention.  Sleep problems.  Feeling tired all the time.  Get help right away if:  You have confusion or unusual drowsiness.  It is difficult to wake you up.  You have nausea or persistent, forceful vomiting.  You feel like you are moving when you are not (vertigo). Your eyes may move rapidly back and forth.  You have convulsions or faint.  You have severe, persistent headaches that are not relieved by medicine.  You cannot use your arms or legs normally.  One of your pupils is larger than the other.  You have clear or bloody discharge from your nose or  ears.  Your problems are getting worse, not better. This information is not intended to replace advice given to you by your health care provider. Make sure you discuss any questions you have with your health care provider. Document Released: 03/28/2002 Document Revised: 12/09/2015 Document Reviewed: 07/21/2013 Elsevier Interactive Patient Education  2017 [employer]   Migraine Headache A migraine headache is a very strong throbbing pain on one side or both sides of your head. Migraines can also cause other symptoms. Talk with your doctor about what things may bring on (trigger) your migraine headaches. Follow these instructions at home: Medicines  Take over-the-counter and prescription medicines only as told by your doctor.  Do not drive or use heavy machinery while taking prescription pain medicine.  To prevent or treat constipation while you are taking prescription pain medicine, your doctor may recommend that you: ? Drink enough fluid to keep your pee (urine) clear or pale  yellow. ? Take over-the-counter or prescription medicines. ? Eat foods that are high in fiber. These include fresh fruits and vegetables, whole grains, and beans. ? Limit foods that are high in fat and processed sugars. These include fried and sweet foods. Lifestyle  Avoid alcohol.  Do not use any products that contain nicotine or tobacco, such as cigarettes and e-cigarettes. If you need help quitting, ask your doctor.  Get at least 8 hours of sleep every night.  Limit your stress. General instructions   Keep a journal to find out what may bring on your migraines. For example, write down: ? What you eat and drink. ? How much sleep you get. ? Any change in what you eat or drink. ? Any change in your medicines.  If you have a migraine: ? Avoid things that make your symptoms worse, such as bright lights. ? It may help to lie down in a dark, quiet room. ? Do not drive or use heavy machinery. ? Ask your doctor what activities are safe for you.  Keep all follow-up visits as told by your doctor. This is important. Contact a doctor if:  You get a migraine that is different or worse than your usual migraines. Get help right away if:  Your migraine gets very bad.  You have a fever.  You have a stiff neck.  You have trouble seeing.  Your muscles feel weak or like you cannot control them.  You start to lose your balance a lot.  You start to have trouble walking.  You pass out (faint). This information is not intended to replace advice given to you by your health care provider. Make sure you discuss any questions you have with your health care provider. Document Released: 01/15/2008 Document Revised: 10/26/2015 Document Reviewed: 09/24/2015 Elsevier Interactive Patient Education  2017 [employer].
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
## 2017-02-18 ENCOUNTER — Ambulatory Visit: Payer: Self-pay | Admitting: Family Medicine

## 2017-02-18 ENCOUNTER — Telehealth: Payer: Self-pay | Admitting: *Deleted

## 2017-02-18 NOTE — Telephone Encounter (Signed)
LVM on home phone requesting call back for lab results.

## 2017-02-19 ENCOUNTER — Ambulatory Visit: Payer: Medicaid Other | Attending: Family Medicine | Admitting: Family Medicine

## 2017-02-19 ENCOUNTER — Encounter: Payer: Self-pay | Admitting: Family Medicine

## 2017-02-19 ENCOUNTER — Ambulatory Visit: Payer: Medicaid Other | Admitting: Licensed Clinical Social Worker

## 2017-02-19 VITALS — BP 122/83 | HR 98 | Temp 98.9°F | Resp 18 | Ht 71.0 in | Wt 192.2 lb

## 2017-02-19 DIAGNOSIS — F4310 Post-traumatic stress disorder, unspecified: Secondary | ICD-10-CM | POA: Diagnosis not present

## 2017-02-19 DIAGNOSIS — H547 Unspecified visual loss: Secondary | ICD-10-CM | POA: Diagnosis not present

## 2017-02-19 DIAGNOSIS — F5105 Insomnia due to other mental disorder: Secondary | ICD-10-CM | POA: Diagnosis not present

## 2017-02-19 DIAGNOSIS — G43909 Migraine, unspecified, not intractable, without status migrainosus: Secondary | ICD-10-CM | POA: Diagnosis present

## 2017-02-19 DIAGNOSIS — M2550 Pain in unspecified joint: Secondary | ICD-10-CM | POA: Diagnosis not present

## 2017-02-19 DIAGNOSIS — M79672 Pain in left foot: Secondary | ICD-10-CM | POA: Diagnosis not present

## 2017-02-19 DIAGNOSIS — F99 Mental disorder, not otherwise specified: Secondary | ICD-10-CM

## 2017-02-19 DIAGNOSIS — H9312 Tinnitus, left ear: Secondary | ICD-10-CM

## 2017-02-19 MED ORDER — IBUPROFEN 600 MG PO TABS: 600.0000 mg | ORAL_TABLET | Freq: Three times a day (TID) | ORAL | 1 refills | Status: AC | PRN

## 2017-02-19 MED ORDER — TRAZODONE HCL 50 MG PO TABS: 25.0000 mg | ORAL_TABLET | Freq: Every evening | ORAL | 0 refills | Status: AC | PRN

## 2017-02-19 MED ORDER — AMITRIPTYLINE HCL 100 MG PO TABS: 100.0000 mg | ORAL_TABLET | Freq: Every day | ORAL | 11 refills | Status: DC

## 2017-02-19 NOTE — Patient Instructions (Signed)

## 2017-02-19 NOTE — Telephone Encounter (Signed)
-----   Message from Anson FretAntonia B Ahern, MD sent at 02/18/2017 11:42 AM EDT ----- Labs unremarkable
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
## 2017-02-19 NOTE — Telephone Encounter (Signed)
Notified by Toma CopierBethany, RN.

## 2017-02-19 NOTE — Progress Notes (Signed)
Patient is here for migraines back pian & nerve pain   Patient complains cant sleep  Feel restless   Patient also complains left back outer ear is leaking

## 2017-02-19 NOTE — Telephone Encounter (Signed)
Called patient and he verbalized understanding of unremarkable labs.

## 2017-02-20 LAB — CREATININE, SERUM
CREATININE: 1.09 mg/dL (ref 0.76–1.27)
GFR calc Af Amer: 105 mL/min/{1.73_m2} (ref >59)
GFR calc non Af Amer: 91 mL/min/{1.73_m2} (ref >59)

## 2017-02-20 LAB — B12 AND FOLATE PANEL
Folate: 7.6 ng/mL (ref >3.0)
VITAMIN B 12: 572 pg/mL (ref 232–1245)

## 2017-02-20 LAB — TSH: TSH: 3.08 u[IU]/mL (ref 0.450–4.500)

## 2017-02-20 LAB — BUN: BUN: 9 mg/dL (ref 6–20)

## 2017-02-20 LAB — METHYLMALONIC ACID, SERUM: Methylmalonic Acid: 99 nmol/L (ref 0–378)
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
## 2017-03-18 ENCOUNTER — Ambulatory Visit: Payer: Medicaid Other | Admitting: Neurology

## 2017-03-18 ENCOUNTER — Ambulatory Visit (INDEPENDENT_AMBULATORY_CARE_PROVIDER_SITE_OTHER): Payer: Medicaid Other | Admitting: Neurology

## 2017-03-18 DIAGNOSIS — R200 Anesthesia of skin: Secondary | ICD-10-CM

## 2017-03-18 DIAGNOSIS — R202 Paresthesia of skin: Secondary | ICD-10-CM | POA: Diagnosis not present

## 2017-03-18 DIAGNOSIS — Z0289 Encounter for other administrative examinations: Secondary | ICD-10-CM

## 2017-03-18 NOTE — Progress Notes (Signed)
See procedure note.

## 2017-03-18 NOTE — Procedures (Signed)
Full Name: Danella Deisrturo Norby Gender: Male MRN #: [id number] Date of Birth: 08/25/1985    Visit Date: 03/18/2017 09:51 Age: 31 Years 4 Months Old Examining Physician: Naomie DeanAntonia Sadiya Durand, MD   History: Hand numbness and neck pain  Summary: All nerve and muscles (as detailed in the following tables) were within normal limits    Conclusion: This is a normal EMG/NCS of the bilateral upper extremities.    Naomie DeanAntonia Amauris Debois M.D.  [facility] [address] [place], [place] [address] Tel: [phone] Fax: [phone]        [facility]    Nerve / Sites Muscle Latency Ref. Amplitude Ref. Rel Amp Segments Distance Velocity Ref. Area    ms ms mV mV %  cm m/s m/s mVms  L Median - APB     Wrist APB 4.2 ?4.4 14.5 ?4.0 100 Wrist - APB 7   66.0     Upper arm APB 8.2  15.5  107 Upper arm - Wrist 22 55 ?49 69.2  R Median - APB     Wrist APB 3.6 ?4.4 9.7 ?4.0 100 Wrist - APB 7   38.4     Upper arm APB 7.8  9.6  99.1 Upper arm - Wrist   ?49 37.1  L Ulnar - ADM     Wrist ADM 3.0 ?3.3 14.9 ?6.0 100 Wrist - ADM 7   60.3     B.Elbow ADM 6.8  14.5  97 B.Elbow - Wrist 21 55 ?49 59.1     A.Elbow ADM 8.5  14.8  102 A.Elbow - B.Elbow 10 58 ?49 60.5         A.Elbow - Wrist      R Ulnar - ADM     Wrist ADM 2.7 ?3.3 12.7 ?6.0 100 Wrist - ADM 7   46.7     B.Elbow ADM 6.1  12.2  95.8 B.Elbow - Wrist 20 58 ?49 47.3     A.Elbow ADM 7.9  12.3  101 A.Elbow - B.Elbow 10 58 ?49 48.9         A.Elbow - Wrist                 SNC    Nerve / Sites Rec. Site Peak Lat Ref.  Amp Ref. Segments Distance Peak Diff Ref.    ms ms V V  cm ms ms  L Median, Ulnar - Transcarpal comparison     Median Palm Wrist 2.2 ?2.2 61 ?35 Median Palm - Wrist 8       Ulnar Palm Wrist 2.1 ?2.2 26 ?12 Ulnar Palm - Wrist 8          Median Palm - Ulnar Palm  0.1 ?0.4  R Median, Ulnar - Transcarpal comparison     Median Palm Wrist 2.2 ?2.2 77 ?35 Median Palm - Wrist 8       Ulnar Palm Wrist 2.1 ?2.2 32 ?12 Ulnar Palm - Wrist 8           Median Palm - Ulnar Palm  0.1 ?0.4  L Median - Orthodromic (Dig II, Mid palm)     Dig II Wrist 3.4 ?3.4 18 ?10 Dig II - Wrist 13    R Median - Orthodromic (Dig II, Mid palm)     Dig II Wrist 3.1 ?3.4 25 ?10 Dig II - Wrist 13    L Ulnar - Orthodromic, (Dig V, Mid palm)     Dig V Wrist 2.9 ?3.1  12 ?5 Dig V - Wrist 11    R Ulnar - Orthodromic, (Dig V, Mid palm)     Dig V Wrist 2.6 ?3.1 15 ?5 Dig V - Wrist 3811                   F  Wave    Nerve F Lat Ref.   ms ms  L Ulnar - ADM 28.4 ?32.0  R Ulnar - ADM 26.1 ?32.0         EMG full       EMG Summary Table    Spontaneous MUAP Recruitment  Muscle IA Fib PSW Fasc Other Amp Dur. Poly Pattern  L. Deltoid Normal None None None _______ Normal Normal Normal Normal  R. Deltoid Normal None None None _______ Normal Normal Normal Normal  L. Triceps brachii Normal None None None _______ Normal Normal Normal Normal  R. Triceps brachii Normal None None None _______ Normal Normal Normal Normal  L. Pronator teres Normal None None None _______ Normal Normal Normal Normal  R. Pronator teres Normal None None None _______ Normal Normal Normal Normal  L. Opponens pollicis Normal None None None _______ Normal Normal Normal Normal  R. Opponens pollicis Normal None None None _______ Normal Normal Normal Normal  L. First dorsal interosseous Normal None None None _______ Normal Normal Normal Normal  R. First dorsal interosseous Normal None None None _______ Normal Normal Normal Normal  L. Cervical paraspinals (low) Normal None None None _______ Normal Normal Normal Normal  R. Cervical paraspinals (low) Normal None None None _______ Normal Normal Normal Normal

## 2017-03-18 NOTE — Progress Notes (Addendum)
      Full Name: Frederick [name] Gender: Male MRN #: [id number] Date of Birth: 02/14/1986    Visit Date: 03/18/2017 09:51 Age: 31 Years 4 Months Old Examining Physician: Myleen Brailsford, MD   History: Hand numbness and neck pain  Summary: All nerve and muscles (as detailed in the following tables) were within normal limits    Conclusion: This is a normal EMG/NCS of the bilateral upper extremities.    Carroll Ranney M.D.  [facility] [address] [place], [place] [address] Tel: [phone] Fax: [phone]        MNC    Nerve / Sites Muscle Latency Ref. Amplitude Ref. Rel Amp Segments Distance Velocity Ref. Area    ms ms mV mV %  cm m/s m/s mVms  L Median - APB     Wrist APB 4.2 ?4.4 14.5 ?4.0 100 Wrist - APB 7   66.0     Upper arm APB 8.2  15.5  107 Upper arm - Wrist 22 55 ?49 69.2  R Median - APB     Wrist APB 3.6 ?4.4 9.7 ?4.0 100 Wrist - APB 7   38.4     Upper arm APB 7.8  9.6  99.1 Upper arm - Wrist   ?49 37.1  L Ulnar - ADM     Wrist ADM 3.0 ?3.3 14.9 ?6.0 100 Wrist - ADM 7   60.3     B.Elbow ADM 6.8  14.5  97 B.Elbow - Wrist 21 55 ?49 59.1     A.Elbow ADM 8.5  14.8  102 A.Elbow - B.Elbow 10 58 ?49 60.5         A.Elbow - Wrist      R Ulnar - ADM     Wrist ADM 2.7 ?3.3 12.7 ?6.0 100 Wrist - ADM 7   46.7     B.Elbow ADM 6.1  12.2  95.8 B.Elbow - Wrist 20 58 ?49 47.3     A.Elbow ADM 7.9  12.3  101 A.Elbow - B.Elbow 10 58 ?49 48.9         A.Elbow - Wrist                 SNC    Nerve / Sites Rec. Site Peak Lat Ref.  Amp Ref. Segments Distance Peak Diff Ref.    ms ms V V  cm ms ms  L Median, Ulnar - Transcarpal comparison     Median Palm Wrist 2.2 ?2.2 61 ?35 Median Palm - Wrist 8       Ulnar Palm Wrist 2.1 ?2.2 26 ?12 Ulnar Palm - Wrist 8          Median Palm - Ulnar Palm  0.1 ?0.4  R Median, Ulnar - Transcarpal comparison     Median Palm Wrist 2.2 ?2.2 77 ?35 Median Palm - Wrist 8       Ulnar Palm Wrist 2.1 ?2.2 32 ?12 Ulnar Palm - Wrist 8           Median Palm - Ulnar Palm  0.1 ?0.4  L Median - Orthodromic (Dig II, Mid palm)     Dig II Wrist 3.4 ?3.4 18 ?10 Dig II - Wrist 13    R Median - Orthodromic (Dig II, Mid palm)     Dig II Wrist 3.1 ?3.4 25 ?10 Dig II - Wrist 13    L Ulnar - Orthodromic, (Dig V, Mid palm)     Dig V Wrist 2.9 ?3.1   12 ?5 Dig V - Wrist 11    R Ulnar - Orthodromic, (Dig V, Mid palm)     Dig V Wrist 2.6 ?3.1 15 ?5 Dig V - Wrist 11                   F  Wave    Nerve F Lat Ref.   ms ms  L Ulnar - ADM 28.4 ?32.0  R Ulnar - ADM 26.1 ?32.0         EMG full       EMG Summary Table    Spontaneous MUAP Recruitment  Muscle IA Fib PSW Fasc Other Amp Dur. Poly Pattern  L. Deltoid Normal None None None _______ Normal Normal Normal Normal  R. Deltoid Normal None None None _______ Normal Normal Normal Normal  L. Triceps brachii Normal None None None _______ Normal Normal Normal Normal  R. Triceps brachii Normal None None None _______ Normal Normal Normal Normal  L. Pronator teres Normal None None None _______ Normal Normal Normal Normal  R. Pronator teres Normal None None None _______ Normal Normal Normal Normal  L. Opponens pollicis Normal None None None _______ Normal Normal Normal Normal  R. Opponens pollicis Normal None None None _______ Normal Normal Normal Normal  L. First dorsal interosseous Normal None None None _______ Normal Normal Normal Normal  R. First dorsal interosseous Normal None None None _______ Normal Normal Normal Normal  L. Cervical paraspinals (low) Normal None None None _______ Normal Normal Normal Normal  R. Cervical paraspinals (low) Normal None None None _______ Normal Normal Normal Normal      

## 2017-03-23 ENCOUNTER — Encounter (INDEPENDENT_AMBULATORY_CARE_PROVIDER_SITE_OTHER): Payer: Self-pay

## 2017-03-23 ENCOUNTER — Ambulatory Visit: Payer: Medicaid Other | Admitting: Neurology

## 2017-03-23 ENCOUNTER — Encounter: Payer: Self-pay | Admitting: Neurology

## 2017-03-23 VITALS — BP 119/74 | HR 86 | Ht 70.5 in | Wt 193.0 lb

## 2017-03-23 DIAGNOSIS — R0683 Snoring: Secondary | ICD-10-CM | POA: Diagnosis not present

## 2017-03-23 DIAGNOSIS — S199XXS Unspecified injury of neck, sequela: Secondary | ICD-10-CM | POA: Insufficient documentation

## 2017-03-23 DIAGNOSIS — K219 Gastro-esophageal reflux disease without esophagitis: Secondary | ICD-10-CM

## 2017-03-23 DIAGNOSIS — R0681 Apnea, not elsewhere classified: Secondary | ICD-10-CM | POA: Diagnosis not present

## 2017-03-23 DIAGNOSIS — G4700 Insomnia, unspecified: Secondary | ICD-10-CM

## 2017-03-23 DIAGNOSIS — G4726 Circadian rhythm sleep disorder, shift work type: Secondary | ICD-10-CM | POA: Diagnosis not present

## 2017-03-23 DIAGNOSIS — F4310 Post-traumatic stress disorder, unspecified: Secondary | ICD-10-CM | POA: Diagnosis not present

## 2017-03-23 MED ORDER — PRAZOSIN HCL 1 MG PO CAPS: 1.0000 mg | ORAL_CAPSULE | Freq: Every day | ORAL | 2 refills | Status: AC
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
## 2017-03-23 NOTE — Patient Instructions (Signed)

## 2017-05-08 ENCOUNTER — Telehealth: Payer: Self-pay | Admitting: Family Medicine

## 2017-05-08 NOTE — Telephone Encounter (Signed)
Patient called requesting for an up to date letter regarding not be able to work due to his medical condition. Patient stated he needed letter before court date on January 23rd if possible. Please fu with pt and give him a call once paperwork is ready.

## 2017-05-10 ENCOUNTER — Ambulatory Visit (INDEPENDENT_AMBULATORY_CARE_PROVIDER_SITE_OTHER): Payer: Medicaid Other | Admitting: Neurology

## 2017-05-10 DIAGNOSIS — S199XXS Unspecified injury of neck, sequela: Secondary | ICD-10-CM

## 2017-05-10 DIAGNOSIS — K219 Gastro-esophageal reflux disease without esophagitis: Secondary | ICD-10-CM

## 2017-05-10 DIAGNOSIS — R0683 Snoring: Secondary | ICD-10-CM

## 2017-05-10 DIAGNOSIS — G4700 Insomnia, unspecified: Secondary | ICD-10-CM

## 2017-05-10 DIAGNOSIS — R0681 Apnea, not elsewhere classified: Secondary | ICD-10-CM

## 2017-05-10 DIAGNOSIS — G4710 Hypersomnia, unspecified: Secondary | ICD-10-CM | POA: Diagnosis not present

## 2017-05-10 DIAGNOSIS — F4310 Post-traumatic stress disorder, unspecified: Secondary | ICD-10-CM

## 2017-05-11 ENCOUNTER — Other Ambulatory Visit: Payer: Self-pay | Admitting: Family Medicine

## 2017-05-11 NOTE — Telephone Encounter (Signed)
Letter available for pick up. [facility] place in [employer] mailbox.

## 2017-05-11 NOTE — Telephone Encounter (Signed)
Pt aware letter is ready for pick up
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
## 2017-05-19 ENCOUNTER — Telehealth: Payer: Self-pay | Admitting: Neurology

## 2017-05-19 NOTE — Telephone Encounter (Signed)
Called the patient to discuss the sleep study results. No answer. LVM for the patient to call back. When patient calls back please make him aware that the sleep study was normal. There was no evidence of sleep apnea or low oxygenation. There is no follow up needed unless the patient request one.

## 2017-05-22 ENCOUNTER — Ambulatory Visit: Payer: Medicaid Other | Admitting: Family Medicine

## 2017-05-25 ENCOUNTER — Ambulatory Visit: Payer: Medicaid Other | Admitting: Family Medicine

## 2017-05-25 NOTE — Telephone Encounter (Signed)
Pt called back and I was able to go over the sleep study results. Pt had no sleep apnea.  Informed the pt that he would not need to follow up unless he just wanted to and he did want to schedule an follow up apt. I was able to schedule him a f/u may 16 at 9:30 am. Pt verbalized understanding. Pt had no questions at this time but was encouraged to call back if questions arise.

## 2017-05-25 NOTE — Telephone Encounter (Signed)
Attempted to call the patient for a 2nd time, no answer. LVM for patient to call back.

## 2017-05-28 ENCOUNTER — Ambulatory Visit: Payer: Medicaid Other | Admitting: Family Medicine

## 2017-05-28 ENCOUNTER — Ambulatory Visit: Payer: Medicaid Other | Admitting: Audiology

## 2017-06-01 ENCOUNTER — Ambulatory Visit: Payer: Medicaid Other | Attending: Family Medicine | Admitting: Family Medicine

## 2017-06-01 ENCOUNTER — Encounter: Payer: Self-pay | Admitting: Family Medicine

## 2017-06-01 VITALS — BP 116/77 | HR 74 | Temp 98.1°F | Resp 16 | Ht 70.0 in | Wt 192.0 lb

## 2017-06-01 DIAGNOSIS — F4310 Post-traumatic stress disorder, unspecified: Secondary | ICD-10-CM | POA: Diagnosis not present

## 2017-06-01 DIAGNOSIS — F99 Mental disorder, not otherwise specified: Secondary | ICD-10-CM

## 2017-06-01 DIAGNOSIS — G43909 Migraine, unspecified, not intractable, without status migrainosus: Secondary | ICD-10-CM | POA: Diagnosis not present

## 2017-06-01 DIAGNOSIS — Z87828 Personal history of other (healed) physical injury and trauma: Secondary | ICD-10-CM

## 2017-06-01 DIAGNOSIS — F5105 Insomnia due to other mental disorder: Secondary | ICD-10-CM | POA: Diagnosis not present

## 2017-06-01 MED ORDER — AMITRIPTYLINE HCL 150 MG PO TABS: 150.0000 mg | ORAL_TABLET | Freq: Every day | ORAL | 5 refills | Status: AC

## 2017-06-01 NOTE — Patient Instructions (Signed)
Posttraumatic Stress Disorder Posttraumatic stress disorder (PTSD) is a mental health disorder that can occur after a traumatic event, such as a threat to life, serious injury, or sexual violence. Some people who experience these types of events may develop PTSD. Sometimes, PTSD can occur in people who hear about trauma that occurs to a close family member or friend. PTSD can happen to anyone at any age. What increases the risk? This condition is more likely to occur in:  Engineer, manufacturing.  People who have been the victims of, or witness to, a traumatic event, such as: ? Domestic violence. ? Childhood physical or sexual abuse. ? Rape. ? Natural disasters. ? Accidents involving serious injury.  What are the signs or symptoms? Symptoms of PTSD can be grouped into several categories: intrusive, avoidance, increased arousal, and negative moods and thoughts. Intrusive Symptoms This is when a person re-experiences the traumatic event through one or more of the following ways:  Distressing dreams.  Feelings of fear, horror, intense sadness, or anger in response to a reminder of the trauma.  Unwanted distressing memories while awake.  Physical reactions triggered by reminders of the trauma, such as increased heart rate, shortness of breath, sweating, and shaking.  Having flashbacks, or feelings like you are going through the event again.  Avoidance Symptoms This is when a person avoids thoughts, conversations, people, or activities that are reminders of the trauma. Symptoms may also include:  Decreased interest or participation in daily activities.  Loss of connection or avoidance of other people.  Increased Arousal Symptoms This is when a person is more sensitive or reacts more easily to their environment. Symptoms may include:  Being easily startled.  Careless or self-destructive behavior.  Irritability.  Feeling on edge.  Difficulty  concentrating.  Verbal or physical outbursts of anger toward other people or objects.  Difficulty sleeping.  Negative Moods or Thoughts These may include:  Belief that oneself or others are bad.  Regular feelings of fear, horror, anger, sadness, guilt, or shame.  Not being able to remember certain parts of the traumatic event.  Blaming themselves or others for the trauma.  Inability to experience positive emotions, such as happiness or love.  PTSD symptoms may start soon after a frightening event or months or years later. Symptoms last at least one month and tend to disrupt relationships, work, and daily activities. How is this diagnosed? PTSD is diagnosed through an assessment by a mental health professional. Frederick [name] will be asked questions about any traumatic events. You will also be asked about how these events have changed your thoughts, mood, behavior, and ability to function on a daily basis. You may also be asked if you use alcohol or drugs. How is this treated? Treatment for PTSD may include:  Medicines. Certain medicines can reduce some PTSD symptoms.  Counseling (cognitive behavioral therapy). Talk therapy with a mental health professional who is experienced in treating PTSD can help.  Eye movement desensitization and reprocessing therapy (EMDR). This type of therapy occurs with a specialized therapist.  Many people with PTSD benefit from a combination of these treatments. If you have other mental health problems, such as depression, alcohol abuse, or drug addiction, your treatment plan will include treatment for these other conditions. Follow these instructions at home: Lifestyle  Find a support group in your community. Often groups are available for [employer] veterans, trauma victims, and family members or caregivers.  Find ways to relax. This may include: ? Breathing exercises. ? Meditation. ?  Yoga. ? Listening to quiet music.  Exercise regularly. Try to do at least  30 minutes of physical activity most days of the week.  Try to get 7-9 hours of sleep each night. To help with sleep: ? Keep your bedroom cool and dark. ? Do not eat a heavy meal within one hour of bedtime. ? Do not drink alcohol or caffeinated drinks before bed. ? Avoid screen time, such as television, computers, tablets, or cell phones before bed.  Do not drink alcohol or take illegal drugs.  Look into volunteer opportunities. This can help you feel more connected to your community.  Take steps to help yourself feel safer at home, such as by installing a security system.  Contact a local organization to find out if you are eligible for a service dog.  Keep daily contact with at least one trusted friend or family member.  If your PTSD is affecting your marriage or family, seek help from a family therapist. General instructions  Take over-the-counter and prescription medicines only as told by your health care provider.  Keep all follow-up visits as told by your health care provider and counselor. This is important.  Make sure to let all of your health care providers know you have PTSD. This is especially important if you are having surgery or need to be admitted to the hospital. Contact a health care provider if:  Your symptoms do not get better or get worse. Get help right away if:  You have thoughts of wanting to harm yourself or others. This information is not intended to replace advice given to you by your health care provider. Make sure you discuss any questions you have with your health care provider. Document Released: 12/31/2000 Document Revised: 07/30/2015 Document Reviewed: 03/23/2015 Elsevier Interactive Patient Education  2018 Elsevier Inc.  
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
## 2017-06-18 ENCOUNTER — Telehealth: Payer: Self-pay | Admitting: Neurology

## 2017-06-18 DIAGNOSIS — R202 Paresthesia of skin: Secondary | ICD-10-CM

## 2017-06-18 DIAGNOSIS — R519 Headache, unspecified: Secondary | ICD-10-CM

## 2017-06-18 DIAGNOSIS — R413 Other amnesia: Secondary | ICD-10-CM

## 2017-06-18 DIAGNOSIS — R5382 Chronic fatigue, unspecified: Secondary | ICD-10-CM

## 2017-06-18 DIAGNOSIS — G8929 Other chronic pain: Secondary | ICD-10-CM

## 2017-06-18 DIAGNOSIS — E538 Deficiency of other specified B group vitamins: Secondary | ICD-10-CM

## 2017-06-18 DIAGNOSIS — R51 Headache: Secondary | ICD-10-CM

## 2017-06-18 NOTE — Telephone Encounter (Signed)
Pt called stating [facility]. Pt requesting a call back once order has been sent.

## 2017-06-19 NOTE — Telephone Encounter (Signed)
Spoke with patient and informed him that CT head order was placed. He verbalized appreciation. He did not have the CT that was ordered in October 2018.

## 2017-06-19 NOTE — Telephone Encounter (Signed)
Fine thanks! 

## 2017-06-19 NOTE — Telephone Encounter (Signed)
Noted, thank you
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
## 2017-06-23 ENCOUNTER — Telehealth: Payer: Self-pay | Admitting: *Deleted

## 2017-06-23 ENCOUNTER — Ambulatory Visit: Payer: Medicaid Other | Admitting: Nurse Practitioner

## 2017-06-23 NOTE — Telephone Encounter (Signed)
Patient came into office today prior to his scheduled follow up this afternoon. He stated he has a CT scan next week and wanted to reschedule his FU after the scan next week. He was rescheduled with Enid Skeens Martin, NP for April.

## 2017-07-03 ENCOUNTER — Other Ambulatory Visit: Payer: Medicaid Other

## 2017-07-03 ENCOUNTER — Telehealth: Payer: Self-pay | Admitting: Neurology

## 2017-07-03 NOTE — Telephone Encounter (Signed)
Medicaid did not approve the CT head. The case number is [id number] and the phone number for th peer to peer is [phone].

## 2017-07-03 NOTE — Telephone Encounter (Signed)
Let patient know he waited too long to schedule, he needs to have a follow up appointment if he wants the CT head since it has been over 5 months since we saw him and recommended head CT. However he needs to see NP and not physician. He can see Frederick [name] if he likes. If he is stable he does not need to follow up or get the CT scan. thanks

## 2017-07-06 NOTE — Telephone Encounter (Signed)
Left a voicemail for patient to call me back. He is going to have to see the NP before having the CT due to insurance.
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
## 2017-07-08 NOTE — Telephone Encounter (Signed)
I called the patient again and was able to get a hold of him.. [facility]  Once he has seen Eber Jonesarolyn then GI will redo the authorization with medicaid.

## 2017-07-09 ENCOUNTER — Ambulatory Visit: Payer: Medicaid Other | Admitting: Nurse Practitioner

## 2017-07-09 ENCOUNTER — Telehealth: Payer: Self-pay | Admitting: *Deleted

## 2017-07-09 ENCOUNTER — Encounter: Payer: Self-pay | Admitting: Nurse Practitioner

## 2017-07-09 ENCOUNTER — Other Ambulatory Visit: Payer: Medicaid Other

## 2017-07-09 ENCOUNTER — Telehealth: Payer: Self-pay | Admitting: Neurology

## 2017-07-09 VITALS — BP 121/74 | HR 92 | Wt 196.0 lb

## 2017-07-09 DIAGNOSIS — G4700 Insomnia, unspecified: Secondary | ICD-10-CM | POA: Diagnosis not present

## 2017-07-09 DIAGNOSIS — R209 Unspecified disturbances of skin sensation: Secondary | ICD-10-CM

## 2017-07-09 DIAGNOSIS — Z72820 Sleep deprivation: Secondary | ICD-10-CM | POA: Diagnosis not present

## 2017-07-09 DIAGNOSIS — IMO0001 Reserved for inherently not codable concepts without codable children: Secondary | ICD-10-CM | POA: Insufficient documentation

## 2017-07-09 DIAGNOSIS — R51 Headache: Secondary | ICD-10-CM | POA: Diagnosis not present

## 2017-07-09 DIAGNOSIS — R519 Headache, unspecified: Secondary | ICD-10-CM

## 2017-07-09 DIAGNOSIS — F4310 Post-traumatic stress disorder, unspecified: Secondary | ICD-10-CM | POA: Diagnosis not present

## 2017-07-09 NOTE — Telephone Encounter (Signed)
Pt has not had CT scan yet and is asking for a call back to know if his appointment needs to be rescheduled until after or if he still needs to see NP today, please call

## 2017-07-09 NOTE — Telephone Encounter (Signed)
Per Enid Skeens Martin, NP, called [facility] Inquired when the patient last picked up Amitriptyline. She stated the last time he picked up a refill was on 04/04/17 for 30 days. She stated they did process the new Rx that they received on 06/01/17, but the patient never picked it up. This RN thanked her for information.

## 2017-07-09 NOTE — Telephone Encounter (Signed)
Pt returned to call and stated he will keep appointment for today, no need to call pt back

## 2017-07-09 NOTE — Telephone Encounter (Signed)
FYI Dr. Lucia GaskinsAhern I saw this pt today so that he could get his CT. I specifically asked him if he was taking his amitriptyline for his headaches and he said he said YES.  No wonder he continues to have a chronic daily headache he is not compliant.

## 2017-07-09 NOTE — Patient Instructions (Signed)
Will obtain CT of the head EMG N/C was normal Sleep study no  parasomnia , seizure, or even apnea noted. This was remarkably normal sleep. No explanation for headaches. Consider cognitive behavior therapy for this patient F/U prn

## 2017-07-13 NOTE — Telephone Encounter (Signed)
Patient returned call. Advised him that this RN spoke with pharmacist at [facility] who stated he never picked up the Amitriptyline refill on 06/01/17. The patient stated "he had a little leftover". This RN asked if he was taking one tab every night. The patent stated he had been, repeated that he had a little leftover. He then stated he had "to go pick it up soon".  This RN advised that he is supposed to take it nightly to prevent his headaches. Patient verbalized understanding.

## 2017-07-13 NOTE — Telephone Encounter (Signed)
LVM requesting call back.

## 2017-08-03 ENCOUNTER — Ambulatory Visit: Payer: Medicaid Other | Admitting: Nurse Practitioner

## 2017-08-31 ENCOUNTER — Ambulatory Visit: Payer: Medicaid Other | Admitting: Nurse Practitioner

## 2017-09-01 ENCOUNTER — Ambulatory Visit: Payer: Medicaid Other | Attending: Family Medicine | Admitting: Audiology

## 2017-09-01 DIAGNOSIS — R292 Abnormal reflex: Secondary | ICD-10-CM | POA: Insufficient documentation

## 2017-09-01 DIAGNOSIS — H93293 Other abnormal auditory perceptions, bilateral: Secondary | ICD-10-CM | POA: Diagnosis present

## 2017-09-01 DIAGNOSIS — H9325 Central auditory processing disorder: Secondary | ICD-10-CM | POA: Diagnosis present

## 2017-09-01 DIAGNOSIS — H9312 Tinnitus, left ear: Secondary | ICD-10-CM | POA: Diagnosis present
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
## 2017-09-03 ENCOUNTER — Encounter: Payer: Self-pay | Admitting: Neurology

## 2017-09-03 ENCOUNTER — Ambulatory Visit: Payer: Medicaid Other | Admitting: Neurology

## 2017-09-03 VITALS — BP 125/80 | HR 74 | Ht 70.0 in | Wt 193.5 lb

## 2017-09-03 DIAGNOSIS — G43111 Migraine with aura, intractable, with status migrainosus: Secondary | ICD-10-CM

## 2017-09-03 DIAGNOSIS — G4719 Other hypersomnia: Secondary | ICD-10-CM | POA: Diagnosis not present

## 2017-09-03 DIAGNOSIS — T732XXD Exhaustion due to exposure, subsequent encounter: Secondary | ICD-10-CM
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
## 2017-09-08 ENCOUNTER — Ambulatory Visit: Payer: Medicaid Other | Admitting: Neurology

## 2017-09-08 ENCOUNTER — Telehealth: Payer: Self-pay | Admitting: *Deleted

## 2017-09-08 NOTE — Telephone Encounter (Signed)
Per Jael, pt stated he needed to r/s and left when copay was discussed today.

## 2017-09-09 ENCOUNTER — Encounter: Payer: Self-pay | Admitting: Neurology

## 2017-09-09 LAB — NARCOLEPSY EVALUATION
DQB1*06:02: NEGATIVE
HLA-DQ ALPHA: NEGATIVE

## 2017-09-11 ENCOUNTER — Telehealth: Payer: Self-pay | Admitting: *Deleted

## 2017-09-11 NOTE — Telephone Encounter (Signed)
-----  Message from Larey Seat, MD sent at 09/09/2017  8:12 AM EDT ----- HLA narcolepsy allele test was negative.

## 2017-09-11 NOTE — Telephone Encounter (Signed)
Spoke with patient. He is aware that his narcolepsy test was negative. He asked for a letter saying he's been out of work. RN discussed this with him and his fiance. He was originally put out of work by PCP. I encouraged pt & fiance to call his PCP for this. He has been seeing [facility]. They verbalized appreciation and understanding.

## 2017-09-15 ENCOUNTER — Telehealth: Payer: Self-pay | Admitting: Neurology

## 2017-09-15 NOTE — Telephone Encounter (Signed)
Pt is aware of a copy of the letter being in a sent status.  Pt is asking if a signed copy of the letter could be made available for him to pick up this afternoon.  Please call
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
## 2017-09-15 NOTE — Telephone Encounter (Signed)
Spoke with patient on the phone about a letter. RN advised him that a no show letter was written last week for his missed appointment with Dr. Lucia Gaskins. The patient thought that he had called the [facility]. RN informed pt that he had called [facility]. His fiance Tiffany (on [place]) then came on the phone (pt aware) and stated "are you a nurse or do you just work at the front desk? I want it to be in his chart that he is having memory problems (long term and short term, brain fog)" and then proceeded to say that is why he is mixed up and calling the wrong places. She was concerned that no one has been able to figure out what is wrong with him and a CT hasn't been done. She said that his CT is more important than a sleep test. RN advised that a CT head was ordered back in October 2018 and was never done. Also discussed that sleep problems can cause memory and focusing problems as well. The patient had called February 28th asking for the CT so the order was placed again on 06/19/17 and he was informed that it was ordered in October. Also, the patient had a CT scheduled for 07/03/17 but the patient canceled. Then on 3/21 he had another CT scheduled but there were issues with insurance. The fiance verbalized understanding. RN asked if she has been able to come to any appointments with him and fiance declined and said no because she has been working. She stated that she tries to keep all of his calls on speaker phone so she can be aware of what is going on. Tiffany was kindly encouraged to be aware of all of his appointments to make sure pt is able to come and to continue trying to be able to listen to the phone calls if he is ok with it. RN offered that if pt is ok, we can make her number priority on his chart. One phone number was taken off his chart per her request because it's no longer valid. She added her number as priority #2 and left his number as priority #1 for contact. Also  billing was discussed generally as she was concerned. RN informed fiance that pt has been unable to pay his $3 copay so he left last appointment without being seen. She said he has memory problems and he has been out of work so that is why he has trouble paying his copay. She has requested for a manager to call her to discuss his billing and insurance situation. RN also advised that she would have  Our referrals dept reach back out to GI to have the patient's CT done as soon as possible. She verbalized appreciation.

## 2017-09-16 ENCOUNTER — Encounter: Payer: Self-pay | Admitting: Neurology

## 2017-09-16 ENCOUNTER — Telehealth: Payer: Self-pay | Admitting: Family Medicine

## 2017-09-16 NOTE — Telephone Encounter (Signed)
We are setting patient up with neuropsych and a concussion clinic. Annabelle Harman can we discuss tomorrow? thanks

## 2017-09-16 NOTE — Telephone Encounter (Signed)
Called patient and LVM stating what Dr. Alvis Lemmings had said about the letter he was requesting.

## 2017-09-16 NOTE — Telephone Encounter (Signed)
Dr. Alvis Lemmings, patients wife called stating that patient has court today and is requesting a letter stating he is under medical care. Patient used to see Arrie Senate and she had wrote a letter for the patient but the court house states that the letter is not specific. I told patients wife that I did not think you would be able to provide patient with the letter since you have not see him. Patient has an appt scheduled for 7/22. Please f/u

## 2017-09-16 NOTE — Telephone Encounter (Signed)
That is correct.  The other option he has is to obtain the letter from neurology whom he saw 2 weeks ago.

## 2017-09-21 ENCOUNTER — Other Ambulatory Visit: Payer: Self-pay | Admitting: Neurology

## 2017-09-21 DIAGNOSIS — R4689 Other symptoms and signs involving appearance and behavior: Principal | ICD-10-CM

## 2017-09-21 DIAGNOSIS — R4189 Other symptoms and signs involving cognitive functions and awareness: Secondary | ICD-10-CM

## 2017-09-21 NOTE — Telephone Encounter (Signed)
[facility]  [phone] - fax [phone] on clinic  I have sent  Referral to Dr. Larey DresserBailiar's office as well.

## 2017-09-22 ENCOUNTER — Other Ambulatory Visit: Payer: Self-pay | Admitting: *Deleted

## 2017-09-22 ENCOUNTER — Telehealth: Payer: Self-pay | Admitting: Neurology

## 2017-09-22 DIAGNOSIS — R4189 Other symptoms and signs involving cognitive functions and awareness: Secondary | ICD-10-CM

## 2017-09-22 DIAGNOSIS — R4689 Other symptoms and signs involving appearance and behavior: Principal | ICD-10-CM

## 2017-09-22 DIAGNOSIS — S0990XS Unspecified injury of head, sequela: Secondary | ICD-10-CM

## 2017-09-22 DIAGNOSIS — W3400XA Accidental discharge from unspecified firearms or gun, initial encounter: Secondary | ICD-10-CM

## 2017-09-22 NOTE — Telephone Encounter (Signed)
FYI- Order is in for concussion clinic.

## 2017-09-22 NOTE — Telephone Encounter (Signed)
Referral has been sent to Dr.  Kieth BrightlyODENBOUGH, [facility]  [facility]     [facility]   Telephone [phone]  Sent via Epic

## 2017-11-09 ENCOUNTER — Ambulatory Visit: Payer: Medicaid Other | Admitting: Family Medicine

## 2022-06-26 ENCOUNTER — Emergency Department (HOSPITAL_BASED_OUTPATIENT_CLINIC_OR_DEPARTMENT_OTHER)
Admission: EM | Admit: 2022-06-26 | Discharge: 2022-06-26 | Disposition: A | Discharge status: Home / Self Care | Payer: Medicaid Other | Attending: Emergency Medicine | Admitting: Emergency Medicine

## 2022-06-26 ENCOUNTER — Other Ambulatory Visit: Payer: Self-pay

## 2022-06-26 ENCOUNTER — Encounter (HOSPITAL_BASED_OUTPATIENT_CLINIC_OR_DEPARTMENT_OTHER): Payer: Self-pay | Admitting: Pediatrics

## 2022-06-26 ENCOUNTER — Emergency Department (HOSPITAL_BASED_OUTPATIENT_CLINIC_OR_DEPARTMENT_OTHER): Payer: Medicaid Other

## 2022-06-26 DIAGNOSIS — J069 Acute upper respiratory infection, unspecified: Secondary | ICD-10-CM | POA: Diagnosis not present

## 2022-06-26 DIAGNOSIS — Z20822 Contact with and (suspected) exposure to covid-19: Secondary | ICD-10-CM | POA: Insufficient documentation

## 2022-06-26 DIAGNOSIS — R059 Cough, unspecified: Secondary | ICD-10-CM | POA: Diagnosis present

## 2022-06-26 LAB — RESP PANEL BY RT-PCR (RSV, FLU A&B, COVID)  RVPGX2
Influenza A by PCR: NEGATIVE
Influenza B by PCR: NEGATIVE
Resp Syncytial Virus by PCR: NEGATIVE
SARS Coronavirus 2 by RT PCR: NEGATIVE

## 2022-06-26 MED ORDER — TRIAMCINOLONE ACETONIDE 55 MCG/ACT NA AERO: 2.0000 | INHALATION_SPRAY | Freq: Every day | NASAL | 12 refills | Status: AC

## 2022-06-26 MED ORDER — CETIRIZINE-PSEUDOEPHEDRINE ER 5-120 MG PO TB12: 1.0000 | ORAL_TABLET | Freq: Every day | ORAL | 0 refills | Status: AC

## 2022-06-26 MED ORDER — BENZONATATE 100 MG PO CAPS: 100.0000 mg | ORAL_CAPSULE | Freq: Three times a day (TID) | ORAL | 0 refills | Status: AC | PRN

## 2022-06-26 NOTE — ED Provider Notes (Signed)
[facility] Provider Note   CSN: [id number] Arrival date & time: 06/26/22  1129     History  Chief Complaint  Patient presents with   Cough    Frederick [name] is a 37 y.o. male.   Cough   37 year old male presents emergency department with complaints of cough, nasal congestion,, sore throat.  Patient reports symptoms beginning February 23.  States that his kids tested positive for influenza B as well as him at that time.  He reports some improvement of symptoms before acute worsening over the past few days.  Denies fever, chills, shortness of breath, abdominal pain, nausea, vomiting, urinary symptoms, hematochezia/melena.  Reports taking at home over-the-counter cold and flu medicine which has helped some.  Presents emergency department for further evaluation.  Past medical history significant for GERD, pneumothorax,  Home Medications Prior to Admission medications   Medication Sig Start Date End Date Taking? Authorizing Provider  benzonatate (TESSALON) 100 MG capsule Take 1 capsule (100 mg total) by mouth 3 (three) times daily as needed for cough. 06/26/22  Yes Dion Saucier A, PA  cetirizine-pseudoephedrine (ZYRTEC-D) 5-120 MG tablet Take 1 tablet by mouth daily. 06/26/22  Yes Dion Saucier A, PA  triamcinolone (NASACORT) 55 MCG/ACT AERO nasal inhaler Place 2 sprays into the nose daily. 06/26/22  Yes Dion Saucier A, PA  albuterol (PROVENTIL HFA;VENTOLIN HFA) 108 (90 Base) MCG/ACT inhaler Inhale 2 puffs into the lungs every 6 (six) hours as needed for wheezing or shortness of breath. 12/19/16   Alfonse Spruce, FNP  amitriptyline (ELAVIL) 150 MG tablet Take 1 tablet (150 mg total) by mouth at bedtime. 06/01/17   Alfonse Spruce, FNP  ibuprofen (ADVIL,MOTRIN) 600 MG tablet Take 1 tablet (600 mg total) by mouth every 8 (eight) hours as needed (Take with food.). 02/19/17   Alfonse Spruce, FNP  methocarbamol (ROBAXIN) 500 MG  tablet Take 1 tablet (500 mg total) by mouth every 8 (eight) hours as needed for muscle spasms. 12/19/16   Alfonse Spruce, FNP  prazosin (MINIPRESS) 1 MG capsule Take 1 capsule (1 mg total) by mouth at bedtime. 03/23/17   Dohmeier, Asencion Partridge, MD  traZODone (DESYREL) 50 MG tablet Take 0.5-1 tablets (25-50 mg total) by mouth at bedtime as needed for sleep. 02/19/17   Alfonse Spruce, FNP      Allergies    Patient has no known allergies.    Review of Systems   Review of Systems  Respiratory:  Positive for cough.   All other systems reviewed and are negative.   Physical Exam Updated Vital Signs BP 108/71 (BP Location: Left Arm)   Pulse 77   Temp 98.1 F (36.7 C) (Oral)   Resp 18   Ht '5\' 9"'$  (1.753 m)   Wt 68 kg   SpO2 100%   BMI 22.15 kg/m  Physical Exam Vitals and nursing note reviewed.  Constitutional:      General: He is not in acute distress.    Appearance: He is well-developed.  HENT:     Head: Normocephalic and atraumatic.     Right Ear: Tympanic membrane normal.     Left Ear: Tympanic membrane normal.     Nose: Congestion and rhinorrhea present.     Mouth/Throat:     Comments: Mild posterior pharyngeal edema.  Uvula midline respiratory with phonation.  Tonsils 0-1+ bilaterally with no obvious exudate.  No sublingual or submandibular swelling appreciated. Eyes:     Conjunctiva/sclera: Conjunctivae  normal.  Cardiovascular:     Rate and Rhythm: Normal rate and regular rhythm.     Heart sounds: No murmur heard. Pulmonary:     Effort: Pulmonary effort is normal. No respiratory distress.     Breath sounds: Normal breath sounds. No stridor. No wheezing, rhonchi or rales.  Abdominal:     Palpations: Abdomen is soft.     Tenderness: There is no abdominal tenderness.  Musculoskeletal:        General: No swelling.     Cervical back: Neck supple.     Right lower leg: No edema.     Left lower leg: No edema.  Skin:    General: Skin is warm and dry.     Capillary  Refill: Capillary refill takes less than 2 seconds.  Neurological:     Mental Status: He is alert.  Psychiatric:        Mood and Affect: Mood normal.     ED Results / Procedures / Treatments   Labs (all labs ordered are listed, but only abnormal results are displayed) Labs Reviewed  RESP PANEL BY RT-PCR (RSV, FLU A&B, COVID)  [id number]    EKG None  Radiology DG Chest 2 View  Result Date: 06/26/2022 CLINICAL DATA:  Cough since June 13, 2022 EXAM: CHEST - 2 VIEW COMPARISON:  Chest radiograph dated August 04, 2015 FINDINGS: The heart size and mediastinal contours are within normal limits. Both lungs are clear. The visualized skeletal structures are unremarkable. IMPRESSION: No active cardiopulmonary disease. Electronically Signed   By: Keane Police D.O.   On: 06/26/2022 15:06    Procedures Procedures    Medications Ordered in ED Medications - No data to display  ED Course/ Medical Decision Making/ A&P                             Medical Decision Making Amount and/or Complexity of Data Reviewed Radiology: ordered.  Risk OTC drugs. Prescription drug management.   This patient presents to the ED for concern of influenza-like illness, this involves an extensive number of treatment options, and is a complaint that carries with it a high risk of complications and morbidity.  The differential diagnosis includes influenza, COVID, RSV, pneumonia, sepsis, meningitis   Co morbidities that complicate the patient evaluation  See HPI   Additional history obtained:  Additional history obtained from EMR External records from outside source obtained and reviewed including hospital records   Lab Tests:  I Ordered, and personally interpreted labs.  The pertinent results include: Respiratory viral panel negative.   Imaging Studies ordered:  I ordered imaging studies including chest x-ray I independently visualized and interpreted imaging which showed no acute cardiopulmonary  abnormalities I agree with the radiologist interpretation   Cardiac Monitoring: / EKG:  The patient was maintained on a cardiac monitor.  I personally viewed and interpreted the cardiac monitored which showed an underlying rhythm of: Sinus rhythm   Consultations Obtained:  N/a   Problem List / ED Course / Critical interventions / Medication management  Viral URI with cough Reevaluation of the patient showed that the patient stayed the same I have reviewed the patients home medicines and have made adjustments as needed   Social Determinants of Health:  Denies tobacco, illicit drug use   Test / Admission - Considered:  Viral URI with cough Vitals signs within normal range and stable throughout visit. Laboratory/imaging studies significant for: See above Patient overall well-appearing,  afebrile in no acute respiratory distress.  No clinical indications for pneumonia.  Symptoms most consistent with viral illness whether prolonged from initial influenza B or another respiratory viral illness.  Will add additional medicines in the form of antihistamine, nasal steroid spray, cough suppressant as needed as well as Tylenol/Motrin as needed for pain/fever.  Recommend close follow-up with primary care for reassessment of symptoms.  Treatment plan discussed with the patient and he acknowledged understanding was agreeable to said plan. Worrisome signs and symptoms were discussed with the patient, and the patient acknowledged understanding to return to the ED if noticed. Patient was stable upon discharge.          Final Clinical Impression(s) / ED Diagnoses Final diagnoses:  Viral URI with cough    Rx / DC Orders ED Discharge Orders          Ordered    cetirizine-pseudoephedrine (ZYRTEC-D) 5-120 MG tablet  Daily        06/26/22 1520    triamcinolone (NASACORT) 55 MCG/ACT AERO nasal inhaler  Daily        06/26/22 1520    benzonatate (TESSALON) 100 MG capsule  3 times daily PRN         06/26/22 1521              Wilnette Kales, [place] 06/26/22 [place], Ankit, MD 06/27/22 [phone]

## 2022-06-26 NOTE — ED Triage Notes (Signed)
Reported been having cough since the 23rd of Feb.

## 2022-06-26 NOTE — ED Notes (Signed)
Pt. Reports he has been out of work since the 23rd with a resp. Illness and goes on and on about different things.  Unable to get clear history of his symptoms.  Pt. Is speaking clear sentences with no break in his speech.  Pt. Keeps talking and only coughs when asked to cough.  Pt. Reports he works at [employer] and reports he has not been to work since the 23rd of Feb.

## 2022-06-26 NOTE — Discharge Instructions (Addendum)
Note the workup today was overall reassuring.  You tested negative for COVID, flu, RSV.  Chest x-ray without signs of pneumonia or other abnormality.  Recommending adding allergy medicine in the form of Zyrtec/Allegra/Claritin as well as nasal steroid spray such as Flonase/Nasacort.  Continue Tylenol/Motrin as needed for pain/fever.  Regarding diarrhea, you may use over-the-counter Imodium as needed.  Recommend follow-up with primary care for reassessment of your symptoms.  Please do not hesitate to return to emergency department for worrisome signs and symptoms we discussed become apparent.

## 2023-09-10 ENCOUNTER — Emergency Department (HOSPITAL_BASED_OUTPATIENT_CLINIC_OR_DEPARTMENT_OTHER)

## 2023-09-10 ENCOUNTER — Other Ambulatory Visit: Payer: Self-pay

## 2023-09-10 ENCOUNTER — Emergency Department (HOSPITAL_BASED_OUTPATIENT_CLINIC_OR_DEPARTMENT_OTHER)
Admission: EM | Admit: 2023-09-10 | Discharge: 2023-09-10 | Disposition: A | Discharge status: Home / Self Care | Attending: Emergency Medicine | Admitting: Emergency Medicine

## 2023-09-10 DIAGNOSIS — M79672 Pain in left foot: Secondary | ICD-10-CM | POA: Insufficient documentation

## 2023-09-10 DIAGNOSIS — M25572 Pain in left ankle and joints of left foot: Secondary | ICD-10-CM | POA: Insufficient documentation

## 2023-09-10 DIAGNOSIS — X501XXA Overexertion from prolonged static or awkward postures, initial encounter: Secondary | ICD-10-CM | POA: Diagnosis not present

## 2023-09-10 MED ORDER — NAPROXEN 500 MG PO TABS: 500.0000 mg | ORAL_TABLET | Freq: Two times a day (BID) | ORAL | 0 refills | Status: AC

## 2023-09-10 MED ORDER — NAPROXEN 250 MG PO TABS
500.0000 mg | ORAL_TABLET | Freq: Once | ORAL | Status: AC
  Administered 2023-09-10: 500 mg via ORAL
  Filled 2023-09-10: qty 2

## 2023-09-10 NOTE — ED Provider Notes (Signed)
 [facility] Provider Note   CSN: [id number] Arrival date & time: 09/10/23  1753     History  Chief Complaint  Patient presents with   Foot Pain    Frederick [name] is a 38 y.o. male presents emergency department for evaluation of left ankle and foot pain.  He reports that he was walking on the street this morning when he tripped and rolled his left ankle.  No other complaints, injuries, complaints prior to injury.   Foot Pain Pertinent negatives include no chest pain, no abdominal pain, no headaches and no shortness of breath.       Home Medications Prior to Admission medications   Medication Sig Start Date End Date Taking? Authorizing Provider  naproxen  (NAPROSYN ) 500 MG tablet Take 1 tablet (500 mg total) by mouth 2 (two) times daily. 09/10/23  Yes Royann Cords, PA  albuterol  (PROVENTIL  HFA;VENTOLIN  HFA) 108 (90 Base) MCG/ACT inhaler Inhale 2 puffs into the lungs every 6 (six) hours as needed for wheezing or shortness of breath. 12/19/16   Carin Charleston, FNP  amitriptyline  (ELAVIL ) 150 MG tablet Take 1 tablet (150 mg total) by mouth at bedtime. 06/01/17   Carin Charleston, FNP  benzonatate  (TESSALON ) 100 MG capsule Take 1 capsule (100 mg total) by mouth 3 (three) times daily as needed for cough. 06/26/22   [place] Butter, PA  cetirizine -pseudoephedrine  (ZYRTEC -D) 5-120 MG tablet Take 1 tablet by mouth daily. 06/26/22   [place] Butter, PA  ibuprofen  (ADVIL ,MOTRIN ) 600 MG tablet Take 1 tablet (600 mg total) by mouth every 8 (eight) hours as needed (Take with food.). 02/19/17   Carin Charleston, FNP  methocarbamol  (ROBAXIN ) 500 MG tablet Take 1 tablet (500 mg total) by mouth every 8 (eight) hours as needed for muscle spasms. 12/19/16   Carin Charleston, FNP  prazosin  (MINIPRESS ) 1 MG capsule Take 1 capsule (1 mg total) by mouth at bedtime. 03/23/17   Dohmeier, Raoul Byes, MD  traZODone  (DESYREL ) 50 MG tablet Take 0.5-1  tablets (25-50 mg total) by mouth at bedtime as needed for sleep. 02/19/17   Carin Charleston, FNP  triamcinolone  (NASACORT ) 55 MCG/ACT AERO nasal inhaler Place 2 sprays into the nose daily. 06/26/22   [place] Butter, PA      Allergies    Patient has no known allergies.    Review of Systems   Review of Systems  Constitutional:  Negative for chills, fatigue and fever.  Respiratory:  Negative for cough, chest tightness, shortness of breath and wheezing.   Cardiovascular:  Negative for chest pain and palpitations.  Gastrointestinal:  Negative for abdominal pain, constipation, diarrhea, nausea and vomiting.  Neurological:  Negative for dizziness, seizures, weakness, light-headedness, numbness and headaches.    Physical Exam Updated Vital Signs BP (!) 142/69   Pulse 90   Temp 98.8 F (37.1 C) (Oral)   Resp 15   Ht 5\' 9"  (1.753 m)   Wt 81.6 kg   SpO2 100%   BMI 26.58 kg/m  Physical Exam Vitals and nursing note reviewed.  Constitutional:      General: He is not in acute distress.    Appearance: Normal appearance.  HENT:     Head: Normocephalic and atraumatic.  Eyes:     Conjunctiva/sclera: Conjunctivae normal.  Cardiovascular:     Rate and Rhythm: Normal rate.  Pulmonary:     Effort: Pulmonary effort is normal. No respiratory distress.     Breath sounds:  Normal breath sounds.  Chest:     Chest wall: No tenderness.  Musculoskeletal:     Right lower leg: No edema.     Left lower leg: No edema.     Comments: TTP of lateral and medial aspects of left ankle.  Also TTP of left calcaneus. No gross swelling, warmth, nor erythema. Motor 5/5 of left ankle dorsi and plantarflexion, left knee flexion and extension.  No TTP of left hip, knee.  Normal movement of left digits.  Sensation 2/2 of L2-S2 of LLE.  Ambulates without difficulty. No BLE swelling nor tenderness along venous system.  Celine Collard' sign negative x 2.  Skin:    Capillary Refill: Capillary refill takes less than 2  seconds.     Coloration: Skin is not jaundiced or pale.  Neurological:     General: No focal deficit present.     Mental Status: He is alert and oriented to person, place, and time. Mental status is at baseline.     Sensory: No sensory deficit.     Motor: No weakness.     Coordination: Coordination normal.     Gait: Gait normal.     ED Results / Procedures / Treatments   Labs (all labs ordered are listed, but only abnormal results are displayed) Labs Reviewed - No data to display  EKG None  Radiology DG Ankle Complete Left Result Date: 09/10/2023 CLINICAL DATA:  Fall with injury.  Tingling. EXAM: LEFT ANKLE COMPLETE - 3+ VIEW COMPARISON:  None Available. FINDINGS: There is no evidence of fracture, dislocation, or joint effusion. Ankle mortise is preserved. There is no evidence of arthropathy or other focal bone abnormality. Soft tissues are unremarkable. IMPRESSION: Negative radiographs of the left ankle. Electronically Signed   By: Chadwick Colonel M.D.   On: 09/10/2023 18:31    Procedures Procedures    Medications Ordered in ED Medications  naproxen  (NAPROSYN ) tablet 500 mg (500 mg Oral Given 09/10/23 2015)    ED Course/ Medical Decision Making/ A&P                                 Medical Decision Making Amount and/or Complexity of Data Reviewed Radiology: ordered.  Risk Prescription drug management.   Patient presents to the ED for concern of ankle and heel pain, this involves an extensive number of treatment options, and is a complaint that carries with it a high risk of complications and morbidity.  The differential diagnosis includes fracture, contusion, dislocation, hemarthrosis, laceration, abrasion   Co morbidities that complicate the patient evaluation  None   Additional history obtained:  Additional history obtained from Nursing   External records from outside source obtained and reviewed including triage RN note     Imaging Studies  ordered:  I ordered imaging studies including left ankle x-ray I independently visualized and interpreted imaging which showed no acute traumatic intraosseous abnormalities I agree with the radiologist interpretation    Medicines ordered and prescription drug management:  I ordered medication including naproxen  for pain Reevaluation of the patient after these medicines showed that the patient improved I have reviewed the patients home medicines and have made adjustments as needed     Problem List / ED Course:  Left ankle and heel pain No gross swelling, warmth, erythremia to LLE.  Motor and sensation intact.  Per triage note, he was complaining of some numbness however there is absolutely no numbness on exam.  Sensation 2/2 of dermatomes L2-S2 of LLE.  Motor 5/5 of all major joints.  Ambulates without difficulty.  LLE is well-perfused so doubt vascular injury Provided dose of naproxen  in ED as well as prescription.  Discussed RICE and other symptomatic care at home X-ray of left ankle, foot shows no acute intraosseous abnormality.  However, this does not rule out ligamentous injury or sprain.  Will provide patient with ankle brace as he does not want a cam boot and have him follow-up with Ortho for further management   Reevaluation:  After the interventions noted above, I reevaluated the patient and found that they have :improved     Dispostion:  After consideration of the diagnostic results and the patients response to treatment, I feel that the patent would benefit from outpatient management with Ortho follow-up.   Discussed ED workup, disposition, return to ED precautions with patient who expresses understanding agrees with plan.  All questions answered to their satisfaction.  They are agreeable to plan.  Discharge instructions provided on paperwork  Final Clinical Impression(s) / ED Diagnoses Final diagnoses:  Acute left ankle pain  Pain of left heel    Rx / DC  Orders ED Discharge Orders          Ordered    naproxen  (NAPROSYN ) 500 MG tablet  2 times daily        09/10/23 2003              Royann Cords, [place] 09/11/23 1446    Tegeler, Marine Sia, MD 09/11/23 2358

## 2023-09-10 NOTE — ED Triage Notes (Signed)
 Pt POV reports tripping yesterday and now has pain to L heel that radiates up L leg.  Denies swelling but reports numbness.

## 2023-09-10 NOTE — Discharge Instructions (Signed)
 Thank for let us  evaluate you today.  Your x-ray was negative for fracture.  This does not rule out ligamentous or strain.  I have sent naproxen  to your pharmacy to use. You may use naproxen  and Tylenol  intermittently every 8 hours as needed for pain.  Please do not use naproxen  with aspirin, Aleve , ibuprofen , Advil  as they are all in the same family.  Also provided with a ankle brace.  Please follow-up with orthopedics recommendation as noted above.
